# Patient Record
Sex: Male | Born: 1995 | Race: White | Hispanic: No | Marital: Married | Smoking: Current every day smoker
Health system: Southern US, Community
[De-identification: ages and names within clinical notes are randomized; demographics above are authoritative.]

## PROBLEM LIST (undated history)

## (undated) DIAGNOSIS — K219 Gastro-esophageal reflux disease without esophagitis: Secondary | ICD-10-CM

## (undated) DIAGNOSIS — S3991XA Unspecified injury of abdomen, initial encounter: Secondary | ICD-10-CM

## (undated) HISTORY — PX: COLON SURGERY: SHX602

---
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
## 2017-08-05 DIAGNOSIS — S3991XA Unspecified injury of abdomen, initial encounter: Secondary | ICD-10-CM

## 2017-08-05 HISTORY — DX: Unspecified injury of abdomen, initial encounter: S39.91XA

## 2017-08-07 ENCOUNTER — Other Ambulatory Visit: Payer: Self-pay

## 2017-08-07 ENCOUNTER — Encounter (HOSPITAL_COMMUNITY): Payer: Self-pay | Admitting: Anesthesiology

## 2017-08-07 ENCOUNTER — Encounter (HOSPITAL_COMMUNITY): Admission: EM | Disposition: A | Discharge status: Home Health | Payer: Self-pay | Source: Home / Self Care

## 2017-08-07 ENCOUNTER — Inpatient Hospital Stay (HOSPITAL_COMMUNITY)
Admission: EM | Admit: 2017-08-07 | Discharge: 2017-08-17 | DRG: 329 | Disposition: A | Discharge status: Home Health | Payer: No Typology Code available for payment source | Attending: General Surgery | Admitting: General Surgery

## 2017-08-07 ENCOUNTER — Emergency Department (HOSPITAL_COMMUNITY): Payer: No Typology Code available for payment source | Admitting: Anesthesiology

## 2017-08-07 ENCOUNTER — Emergency Department (HOSPITAL_COMMUNITY): Payer: No Typology Code available for payment source

## 2017-08-07 DIAGNOSIS — K651 Peritoneal abscess: Secondary | ICD-10-CM | POA: Diagnosis present

## 2017-08-07 DIAGNOSIS — Y9289 Other specified places as the place of occurrence of the external cause: Secondary | ICD-10-CM | POA: Diagnosis not present

## 2017-08-07 DIAGNOSIS — K567 Ileus, unspecified: Secondary | ICD-10-CM | POA: Diagnosis not present

## 2017-08-07 DIAGNOSIS — Y9389 Activity, other specified: Secondary | ICD-10-CM | POA: Diagnosis not present

## 2017-08-07 DIAGNOSIS — T8149XA Infection following a procedure, other surgical site, initial encounter: Secondary | ICD-10-CM

## 2017-08-07 DIAGNOSIS — K219 Gastro-esophageal reflux disease without esophagitis: Secondary | ICD-10-CM | POA: Diagnosis present

## 2017-08-07 DIAGNOSIS — R188 Other ascites: Secondary | ICD-10-CM

## 2017-08-07 DIAGNOSIS — S36438A Laceration of other part of small intestine, initial encounter: Secondary | ICD-10-CM | POA: Diagnosis present

## 2017-08-07 DIAGNOSIS — R109 Unspecified abdominal pain: Secondary | ICD-10-CM

## 2017-08-07 DIAGNOSIS — R509 Fever, unspecified: Secondary | ICD-10-CM

## 2017-08-07 DIAGNOSIS — F17210 Nicotine dependence, cigarettes, uncomplicated: Secondary | ICD-10-CM | POA: Diagnosis present

## 2017-08-07 DIAGNOSIS — Y990 Civilian activity done for income or pay: Secondary | ICD-10-CM

## 2017-08-07 DIAGNOSIS — W228XXA Striking against or struck by other objects, initial encounter: Secondary | ICD-10-CM | POA: Diagnosis present

## 2017-08-07 DIAGNOSIS — R05 Cough: Secondary | ICD-10-CM

## 2017-08-07 DIAGNOSIS — R198 Other specified symptoms and signs involving the digestive system and abdomen: Secondary | ICD-10-CM | POA: Diagnosis present

## 2017-08-07 DIAGNOSIS — R058 Other specified cough: Secondary | ICD-10-CM

## 2017-08-07 HISTORY — DX: Gastro-esophageal reflux disease without esophagitis: K21.9

## 2017-08-07 HISTORY — PX: APPENDECTOMY: SHX54

## 2017-08-07 HISTORY — DX: Unspecified injury of abdomen, initial encounter: S39.91XA

## 2017-08-07 HISTORY — PX: ILEOCECETOMY: SHX5857

## 2017-08-07 HISTORY — PX: LAPAROTOMY: SHX154

## 2017-08-07 HISTORY — PX: BOWEL RESECTION: SHX1257

## 2017-08-07 LAB — COMPREHENSIVE METABOLIC PANEL
ALT: 13 U/L — ABNORMAL LOW (ref 17–63)
ANION GAP: 13 (ref 5–15)
AST: 17 U/L (ref 15–41)
Albumin: 4.3 g/dL (ref 3.5–5.0)
Alkaline Phosphatase: 72 U/L (ref 38–126)
BUN: 17 mg/dL (ref 6–20)
CO2: 23 mmol/L (ref 22–32)
Calcium: 10 mg/dL (ref 8.9–10.3)
Chloride: 101 mmol/L (ref 101–111)
Creatinine, Ser: 1.15 mg/dL (ref 0.61–1.24)
GFR calc Af Amer: >60 mL/min (ref >60)
GLUCOSE: 142 mg/dL — AB (ref 65–99)
POTASSIUM: 4.1 mmol/L (ref 3.5–5.1)
SODIUM: 137 mmol/L (ref 135–145)
Total Bilirubin: 1.5 mg/dL — ABNORMAL HIGH (ref 0.3–1.2)
Total Protein: 8.3 g/dL — ABNORMAL HIGH (ref 6.5–8.1)

## 2017-08-07 LAB — TYPE AND SCREEN
ABO/RH(D): O NEG
ABO/RH(D): O NEG
ANTIBODY SCREEN: NEGATIVE
Antibody Screen: NEGATIVE

## 2017-08-07 LAB — CBC WITH DIFFERENTIAL/PLATELET
BAND NEUTROPHILS: 15 %
BASOS ABS: 0 10*3/uL (ref 0.0–0.1)
BASOS PCT: 0 %
EOS ABS: 0 10*3/uL (ref 0.0–0.7)
Eosinophils Relative: 0 %
HCT: 48.3 % (ref 39.0–52.0)
HEMOGLOBIN: 17.3 g/dL — AB (ref 13.0–17.0)
LYMPHS PCT: 0 %
Lymphs Abs: 0 10*3/uL — ABNORMAL LOW (ref 0.7–4.0)
MCH: 30.2 pg (ref 26.0–34.0)
MCHC: 35.8 g/dL (ref 30.0–36.0)
MCV: 84.3 fL (ref 78.0–100.0)
MONO ABS: 1.4 10*3/uL — AB (ref 0.1–1.0)
MONOS PCT: 7 %
NEUTROS PCT: 78 %
Neutro Abs: 19.1 10*3/uL — ABNORMAL HIGH (ref 1.7–7.7)
PLATELETS: 204 10*3/uL (ref 150–400)
RBC: 5.73 MIL/uL (ref 4.22–5.81)
RDW: 13.5 % (ref 11.5–15.5)
WBC MORPHOLOGY: INCREASED
WBC: 20.5 10*3/uL — ABNORMAL HIGH (ref 4.0–10.5)

## 2017-08-07 LAB — LIPASE, BLOOD: LIPASE: 23 U/L (ref 11–51)

## 2017-08-07 LAB — URINALYSIS, ROUTINE W REFLEX MICROSCOPIC
BILIRUBIN URINE: NEGATIVE
GLUCOSE, UA: NEGATIVE mg/dL
Ketones, ur: 20 mg/dL — AB
LEUKOCYTES UA: NEGATIVE
NITRITE: NEGATIVE
PH: 5 (ref 5.0–8.0)
Protein, ur: 100 mg/dL — AB
Specific Gravity, Urine: 1.032 — ABNORMAL HIGH (ref 1.005–1.030)

## 2017-08-07 LAB — ABO/RH: ABO/RH(D): O NEG

## 2017-08-07 SURGERY — EXCISION, CECUM WITH ILEUM
Anesthesia: General

## 2017-08-07 MED ORDER — DEXAMETHASONE SODIUM PHOSPHATE 10 MG/ML IJ SOLN
INTRAMUSCULAR | Status: AC
  Filled 2017-08-07: qty 1

## 2017-08-07 MED ORDER — ONDANSETRON HCL 4 MG/2ML IJ SOLN
INTRAMUSCULAR | Status: AC
  Filled 2017-08-07: qty 2

## 2017-08-07 MED ORDER — DIPHENHYDRAMINE HCL 25 MG PO CAPS: 25.0000 mg | ORAL_CAPSULE | Freq: Four times a day (QID) | ORAL | Status: DC | PRN

## 2017-08-07 MED ORDER — MORPHINE SULFATE (PF) 4 MG/ML IV SOLN
4.0000 mg | Freq: Once | INTRAVENOUS | Status: AC
  Administered 2017-08-07: 4 mg via INTRAVENOUS
  Filled 2017-08-07: qty 1

## 2017-08-07 MED ORDER — DIPHENHYDRAMINE HCL 50 MG/ML IJ SOLN: 25.0000 mg | Freq: Four times a day (QID) | INTRAMUSCULAR | Status: DC | PRN

## 2017-08-07 MED ORDER — PROPOFOL 10 MG/ML IV BOLUS
INTRAVENOUS | Status: DC | PRN
  Administered 2017-08-07: 120 mg via INTRAVENOUS

## 2017-08-07 MED ORDER — ROCURONIUM BROMIDE 100 MG/10ML IV SOLN
INTRAVENOUS | Status: DC | PRN
  Administered 2017-08-07: 20 mg via INTRAVENOUS
  Administered 2017-08-07: 50 mg via INTRAVENOUS

## 2017-08-07 MED ORDER — ONDANSETRON 4 MG PO TBDP
4.0000 mg | ORAL_TABLET | Freq: Four times a day (QID) | ORAL | Status: DC | PRN
  Administered 2017-08-12: 4 mg via ORAL
  Filled 2017-08-07: qty 1

## 2017-08-07 MED ORDER — ONDANSETRON HCL 4 MG/2ML IJ SOLN
4.0000 mg | Freq: Once | INTRAMUSCULAR | Status: AC
  Administered 2017-08-07: 4 mg via INTRAVENOUS
  Filled 2017-08-07: qty 2

## 2017-08-07 MED ORDER — SODIUM CHLORIDE 0.9 % IV BOLUS
1000.0000 mL | Freq: Once | INTRAVENOUS | Status: AC
  Administered 2017-08-07: 1000 mL via INTRAVENOUS

## 2017-08-07 MED ORDER — SUGAMMADEX SODIUM 200 MG/2ML IV SOLN
INTRAVENOUS | Status: DC | PRN
  Administered 2017-08-07: 160 mg via INTRAVENOUS

## 2017-08-07 MED ORDER — ONDANSETRON HCL 4 MG/2ML IJ SOLN: 4.0000 mg | Freq: Four times a day (QID) | INTRAMUSCULAR | Status: DC | PRN

## 2017-08-07 MED ORDER — PIPERACILLIN-TAZOBACTAM 3.375 G IVPB 30 MIN
3.3750 g | Freq: Once | INTRAVENOUS | Status: AC
  Administered 2017-08-07: 3.375 g via INTRAVENOUS
  Filled 2017-08-07: qty 50

## 2017-08-07 MED ORDER — DEXAMETHASONE SODIUM PHOSPHATE 10 MG/ML IJ SOLN
INTRAMUSCULAR | Status: DC | PRN
  Administered 2017-08-07: 5 mg via INTRAVENOUS

## 2017-08-07 MED ORDER — LIDOCAINE HCL (CARDIAC) PF 100 MG/5ML IV SOSY
PREFILLED_SYRINGE | INTRAVENOUS | Status: DC | PRN
  Administered 2017-08-07: 60 mg via INTRAVENOUS

## 2017-08-07 MED ORDER — NALOXONE HCL 0.4 MG/ML IJ SOLN: 0.4000 mg | INTRAMUSCULAR | Status: DC | PRN

## 2017-08-07 MED ORDER — KETOROLAC TROMETHAMINE 30 MG/ML IJ SOLN
INTRAMUSCULAR | Status: AC
  Administered 2017-08-07: 30 mg via INTRAVENOUS
  Filled 2017-08-07: qty 1

## 2017-08-07 MED ORDER — KETOROLAC TROMETHAMINE 30 MG/ML IJ SOLN
30.0000 mg | Freq: Four times a day (QID) | INTRAMUSCULAR | Status: AC
  Administered 2017-08-07 – 2017-08-08 (×3): 30 mg via INTRAVENOUS
  Filled 2017-08-07 (×3): qty 1

## 2017-08-07 MED ORDER — IOHEXOL 300 MG/ML  SOLN
100.0000 mL | Freq: Once | INTRAMUSCULAR | Status: AC | PRN
  Administered 2017-08-07: 100 mL via INTRAVENOUS

## 2017-08-07 MED ORDER — PIPERACILLIN-TAZOBACTAM 3.375 G IVPB
3.3750 g | Freq: Three times a day (TID) | INTRAVENOUS | Status: DC
  Administered 2017-08-07 – 2017-08-11 (×11): 3.375 g via INTRAVENOUS
  Filled 2017-08-07 (×12): qty 50

## 2017-08-07 MED ORDER — ARTIFICIAL TEARS OPHTHALMIC OINT
TOPICAL_OINTMENT | OPHTHALMIC | Status: DC | PRN
  Administered 2017-08-07: 1 via OPHTHALMIC

## 2017-08-07 MED ORDER — POTASSIUM CHLORIDE IN NACL 20-0.9 MEQ/L-% IV SOLN
INTRAVENOUS | Status: DC
  Administered 2017-08-07 – 2017-08-16 (×18): via INTRAVENOUS
  Filled 2017-08-07 (×19): qty 1000

## 2017-08-07 MED ORDER — SUCCINYLCHOLINE CHLORIDE 20 MG/ML IJ SOLN
INTRAMUSCULAR | Status: DC | PRN
  Administered 2017-08-07: 70 mg via INTRAVENOUS

## 2017-08-07 MED ORDER — HYDROMORPHONE HCL 2 MG/ML IJ SOLN
0.2500 mg | INTRAMUSCULAR | Status: DC | PRN
  Administered 2017-08-07 (×4): 0.5 mg via INTRAVENOUS

## 2017-08-07 MED ORDER — SUCCINYLCHOLINE CHLORIDE 200 MG/10ML IV SOSY
PREFILLED_SYRINGE | INTRAVENOUS | Status: AC
  Filled 2017-08-07: qty 10

## 2017-08-07 MED ORDER — SUGAMMADEX SODIUM 200 MG/2ML IV SOLN
INTRAVENOUS | Status: AC
  Filled 2017-08-07: qty 2

## 2017-08-07 MED ORDER — ONDANSETRON HCL 4 MG/2ML IJ SOLN
INTRAMUSCULAR | Status: DC | PRN
  Administered 2017-08-07: 4 mg via INTRAVENOUS

## 2017-08-07 MED ORDER — DIPHENHYDRAMINE HCL 50 MG/ML IJ SOLN: 12.5000 mg | Freq: Four times a day (QID) | INTRAMUSCULAR | Status: DC | PRN

## 2017-08-07 MED ORDER — ONDANSETRON HCL 4 MG/2ML IJ SOLN
4.0000 mg | Freq: Once | INTRAMUSCULAR | Status: AC
Start: 2017-08-07 — End: 2017-08-07
  Administered 2017-08-07: 4 mg via INTRAVENOUS
  Filled 2017-08-07: qty 2

## 2017-08-07 MED ORDER — FENTANYL CITRATE (PF) 250 MCG/5ML IJ SOLN
INTRAMUSCULAR | Status: AC
Start: 2017-08-07 — End: ?
  Filled 2017-08-07: qty 5

## 2017-08-07 MED ORDER — ENOXAPARIN SODIUM 40 MG/0.4ML ~~LOC~~ SOLN
40.0000 mg | SUBCUTANEOUS | Status: DC
  Administered 2017-08-08 – 2017-08-14 (×7): 40 mg via SUBCUTANEOUS
  Filled 2017-08-07 (×7): qty 0.4

## 2017-08-07 MED ORDER — LACTATED RINGERS IV SOLN
INTRAVENOUS | Status: DC | PRN
Start: 2017-08-07 — End: 2017-08-07
  Administered 2017-08-07 (×3): via INTRAVENOUS

## 2017-08-07 MED ORDER — ARTIFICIAL TEARS OPHTHALMIC OINT
TOPICAL_OINTMENT | OPHTHALMIC | Status: AC
  Filled 2017-08-07: qty 3.5

## 2017-08-07 MED ORDER — ROCURONIUM BROMIDE 10 MG/ML (PF) SYRINGE
PREFILLED_SYRINGE | INTRAVENOUS | Status: AC
  Filled 2017-08-07: qty 5

## 2017-08-07 MED ORDER — HYDROMORPHONE HCL 2 MG/ML IJ SOLN
INTRAMUSCULAR | Status: AC
  Administered 2017-08-07: 0.5 mg via INTRAVENOUS
  Filled 2017-08-07: qty 1

## 2017-08-07 MED ORDER — MORPHINE SULFATE 2 MG/ML IV SOLN
INTRAVENOUS | Status: DC
  Administered 2017-08-07: 19:00:00 via INTRAVENOUS
  Administered 2017-08-08: 12 mg via INTRAVENOUS
  Administered 2017-08-08: 10.5 mg via INTRAVENOUS
  Administered 2017-08-08: 3 mg via INTRAVENOUS
  Administered 2017-08-08 (×2): 6 mg via INTRAVENOUS
  Administered 2017-08-08 (×2): 4.5 mg via INTRAVENOUS
  Administered 2017-08-09: 0 mg via INTRAVENOUS
  Administered 2017-08-09: 6 mg via INTRAVENOUS
  Administered 2017-08-09: 04:00:00 via INTRAVENOUS
  Administered 2017-08-09: 9 mg via INTRAVENOUS
  Administered 2017-08-09: 1.5 mg via INTRAVENOUS
  Administered 2017-08-10 (×2): 0 mg via INTRAVENOUS
  Filled 2017-08-07 (×2): qty 30
  Filled 2017-08-07: qty 25

## 2017-08-07 MED ORDER — MIDAZOLAM HCL 5 MG/5ML IJ SOLN
INTRAMUSCULAR | Status: DC | PRN
  Administered 2017-08-07: 1 mg via INTRAVENOUS

## 2017-08-07 MED ORDER — FENTANYL CITRATE (PF) 100 MCG/2ML IJ SOLN
INTRAMUSCULAR | Status: DC | PRN
  Administered 2017-08-07 (×5): 50 ug via INTRAVENOUS

## 2017-08-07 MED ORDER — PROPOFOL 10 MG/ML IV BOLUS
INTRAVENOUS | Status: AC
  Filled 2017-08-07: qty 40

## 2017-08-07 MED ORDER — SODIUM CHLORIDE 0.9% FLUSH: 9.0000 mL | INTRAVENOUS | Status: DC | PRN

## 2017-08-07 MED ORDER — 0.9 % SODIUM CHLORIDE (POUR BTL) OPTIME
TOPICAL | Status: DC | PRN
  Administered 2017-08-07: 3000 mL

## 2017-08-07 MED ORDER — DIPHENHYDRAMINE HCL 12.5 MG/5ML PO ELIX: 12.5000 mg | ORAL_SOLUTION | Freq: Four times a day (QID) | ORAL | Status: DC | PRN

## 2017-08-07 MED ORDER — MIDAZOLAM HCL 2 MG/2ML IJ SOLN
INTRAMUSCULAR | Status: AC
  Filled 2017-08-07: qty 2

## 2017-08-07 MED ORDER — LIDOCAINE 2% (20 MG/ML) 5 ML SYRINGE
INTRAMUSCULAR | Status: AC
  Filled 2017-08-07: qty 5

## 2017-08-07 SURGICAL SUPPLY — 42 items
BLADE CLIPPER SURG (BLADE) ×2 IMPLANT
BNDG GAUZE ELAST 4 BULKY (GAUZE/BANDAGES/DRESSINGS) ×2 IMPLANT
CANISTER SUCT 3000ML PPV (MISCELLANEOUS) ×2 IMPLANT
COVER SURGICAL LIGHT HANDLE (MISCELLANEOUS) ×2 IMPLANT
DRAPE LAPAROSCOPIC ABDOMINAL (DRAPES) ×2 IMPLANT
DRAPE WARM FLUID 44X44 (DRAPE) ×2 IMPLANT
DRSG OPSITE POSTOP 4X10 (GAUZE/BANDAGES/DRESSINGS) IMPLANT
DRSG OPSITE POSTOP 4X8 (GAUZE/BANDAGES/DRESSINGS) IMPLANT
ELECT BLADE 6.5 EXT (BLADE) IMPLANT
ELECT CAUTERY BLADE 6.4 (BLADE) ×2 IMPLANT
ELECT REM PT RETURN 9FT ADLT (ELECTROSURGICAL) ×2
ELECTRODE REM PT RTRN 9FT ADLT (ELECTROSURGICAL) ×1 IMPLANT
GAUZE SPONGE 4X4 12PLY STRL LF (GAUZE/BANDAGES/DRESSINGS) ×2 IMPLANT
GLOVE SURG SIGNA 7.5 PF LTX (GLOVE) ×2 IMPLANT
GOWN STRL REUS W/ TWL LRG LVL3 (GOWN DISPOSABLE) ×1 IMPLANT
GOWN STRL REUS W/ TWL XL LVL3 (GOWN DISPOSABLE) ×1 IMPLANT
GOWN STRL REUS W/TWL LRG LVL3 (GOWN DISPOSABLE) ×1
GOWN STRL REUS W/TWL XL LVL3 (GOWN DISPOSABLE) ×1
KIT BASIN OR (CUSTOM PROCEDURE TRAY) ×2 IMPLANT
KIT TURNOVER KIT B (KITS) ×2 IMPLANT
LIGASURE IMPACT 36 18CM CVD LR (INSTRUMENTS) ×2 IMPLANT
NS IRRIG 1000ML POUR BTL (IV SOLUTION) ×12 IMPLANT
PACK GENERAL/GYN (CUSTOM PROCEDURE TRAY) ×2 IMPLANT
PAD ARMBOARD 7.5X6 YLW CONV (MISCELLANEOUS) ×2 IMPLANT
RELOAD PROXIMATE 75MM BLUE (ENDOMECHANICALS) ×4 IMPLANT
SPECIMEN JAR LARGE (MISCELLANEOUS) IMPLANT
SPONGE LAP 18X18 X RAY DECT (DISPOSABLE) IMPLANT
STAPLER GUN LINEAR PROX 60 (STAPLE) ×2 IMPLANT
STAPLER PROXIMATE 75MM BLUE (STAPLE) ×2 IMPLANT
STAPLER VISISTAT 35W (STAPLE) ×2 IMPLANT
SUCTION POOLE TIP (SUCTIONS) ×2 IMPLANT
SUT PDS AB 1 TP1 96 (SUTURE) ×4 IMPLANT
SUT SILK 2 0 SH CR/8 (SUTURE) ×2 IMPLANT
SUT SILK 2 0 TIES 10X30 (SUTURE) ×2 IMPLANT
SUT SILK 3 0 SH CR/8 (SUTURE) ×6 IMPLANT
SUT SILK 3 0 TIES 10X30 (SUTURE) ×2 IMPLANT
SUT VIC AB 3-0 SH 18 (SUTURE) ×2 IMPLANT
TAPE CLOTH SURG 6X10 WHT LF (GAUZE/BANDAGES/DRESSINGS) ×2 IMPLANT
TOWEL OR 17X24 6PK STRL BLUE (TOWEL DISPOSABLE) ×2 IMPLANT
TOWEL OR 17X26 10 PK STRL BLUE (TOWEL DISPOSABLE) ×2 IMPLANT
TRAY FOLEY W/METER SILVER 16FR (SET/KITS/TRAYS/PACK) ×2 IMPLANT
YANKAUER SUCT BULB TIP NO VENT (SUCTIONS) IMPLANT
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
## 2017-08-07 NOTE — Transfer of Care (Signed)
Immediate Anesthesia Transfer of Care Note  Patient: Warren GristKevin Pennington  Procedure(s) Performed: EXPLORATORY LAPAROTOMY (N/A ) SMALL BOWEL RESECTION (N/A ) ILEOCECETOMY (N/A )  Patient Location: PACU  Anesthesia Type:General  Level of Consciousness: awake, alert  and oriented  Airway & Oxygen Therapy: Patient Spontanous Breathing and Patient connected to nasal cannula oxygen  Post-op Assessment: Report given to RN, Post -op Vital signs reviewed and stable and Patient moving all extremities X 4  Post vital signs: Reviewed and stable  Last Vitals:  Vitals Value Taken Time  BP 153/99 08/07/2017  6:37 PM  Temp    Pulse 118 08/07/2017  6:40 PM  Resp 22 08/07/2017  6:40 PM  SpO2 95 % 08/07/2017  6:40 PM  Vitals shown include unvalidated device data.  Last Pain:  Vitals:   08/07/17 1554  TempSrc:   PainSc: 3          Complications: No apparent anesthesia complications
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
## 2017-08-07 NOTE — ED Notes (Signed)
Patient transported to CT 

## 2017-08-07 NOTE — ED Notes (Signed)
[facility].  MD Magnus IvanBlackman is accepting provider  MD Donell BeersByerly is the sending provider.
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
## 2017-08-07 NOTE — Anesthesia Preprocedure Evaluation (Addendum)
Anesthesia Evaluation  Patient identified by MRN, date of birth, ID band Patient awake    Reviewed: Allergy & Precautions, NPO status , Patient's Chart, lab work & pertinent test results  History of Anesthesia Complications Negative for: history of anesthetic complications  Airway Mallampati: I  TM Distance: >3 FB Neck ROM: Full    Dental  (+) Dental Advisory Given, Teeth Intact,    Pulmonary neg pulmonary ROS,    breath sounds clear to auscultation       Cardiovascular negative cardio ROS   Rhythm:Regular     Neuro/Psych negative neurological ROS  negative psych ROS   GI/Hepatic Neg liver ROS, Perforated bowl with peritonitis    Endo/Other  negative endocrine ROS  Renal/GU negative Renal ROS     Musculoskeletal negative musculoskeletal ROS (+)   Abdominal   Peds  Hematology negative hematology ROS (+)   Anesthesia Other Findings   Reproductive/Obstetrics                            Anesthesia Physical Anesthesia Plan  ASA: I and emergent  Anesthesia Plan: General   Post-op Pain Management:    Induction: Intravenous, Rapid sequence and Cricoid pressure planned  PONV Risk Score and Plan: 2 and Ondansetron and Dexamethasone  Airway Management Planned: Oral ETT  Additional Equipment:   Intra-op Plan:   Post-operative Plan: Extubation in OR and Possible Post-op intubation/ventilation  Informed Consent: I have reviewed the patients History and Physical, chart, labs and discussed the procedure including the risks, benefits and alternatives for the proposed anesthesia with the patient or authorized representative who has indicated his/her understanding and acceptance.   Dental advisory given  Plan Discussed with: CRNA and Surgeon  Anesthesia Plan Comments:         Anesthesia Quick Evaluation
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
## 2017-08-07 NOTE — ED Notes (Signed)
ED Provider at bedside. 

## 2017-08-07 NOTE — Anesthesia Procedure Notes (Signed)
Procedure Name: Intubation Date/Time: 08/07/2017 5:09 PM Performed by: Edmonia CaprioAuston, Loriene Taunton M, CRNA Pre-anesthesia Checklist: Patient identified, Emergency Drugs available, Patient being monitored, Suction available and Timeout performed Patient Re-evaluated:Patient Re-evaluated prior to induction Oxygen Delivery Method: Circle system utilized Preoxygenation: Pre-oxygenation with 100% oxygen Induction Type: Rapid sequence Laryngoscope Size: Miller and 2 Grade View: Grade I Tube type: Oral Tube size: 7.5 mm Number of attempts: 1 Airway Equipment and Method: Stylet Placement Confirmation: ETT inserted through vocal cords under direct vision,  breath sounds checked- equal and bilateral and positive ETCO2 Secured at: 22 cm Tube secured with: Tape Dental Injury: Teeth and Oropharynx as per pre-operative assessment  Comments: Oropharynx/cords clear on DL

## 2017-08-08 ENCOUNTER — Encounter (HOSPITAL_COMMUNITY): Payer: Self-pay | Admitting: Surgery

## 2017-08-08 LAB — BASIC METABOLIC PANEL
ANION GAP: 7 (ref 5–15)
BUN: 16 mg/dL (ref 6–20)
CALCIUM: 8.5 mg/dL — AB (ref 8.9–10.3)
CHLORIDE: 105 mmol/L (ref 101–111)
CO2: 25 mmol/L (ref 22–32)
Creatinine, Ser: 0.98 mg/dL (ref 0.61–1.24)
GFR calc non Af Amer: >60 mL/min (ref >60)
Glucose, Bld: 128 mg/dL — ABNORMAL HIGH (ref 65–99)
Potassium: 4.3 mmol/L (ref 3.5–5.1)
SODIUM: 137 mmol/L (ref 135–145)

## 2017-08-08 LAB — CBC
HEMATOCRIT: 38 % — AB (ref 39.0–52.0)
HEMOGLOBIN: 12.9 g/dL — AB (ref 13.0–17.0)
MCH: 29.2 pg (ref 26.0–34.0)
MCHC: 33.9 g/dL (ref 30.0–36.0)
MCV: 86 fL (ref 78.0–100.0)
Platelets: 190 10*3/uL (ref 150–400)
RBC: 4.42 MIL/uL (ref 4.22–5.81)
RDW: 13.6 % (ref 11.5–15.5)
WBC: 8.9 10*3/uL (ref 4.0–10.5)

## 2017-08-08 LAB — ABO/RH: ABO/RH(D): O NEG
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
## 2017-08-08 NOTE — Anesthesia Postprocedure Evaluation (Signed)
Anesthesia Post Note  Patient: Warren Pennington  Procedure(s) Performed: EXPLORATORY LAPAROTOMY (N/A ) SMALL BOWEL RESECTION (N/A ) ILEOCECETOMY (N/A )     Patient location during evaluation: PACU Anesthesia Type: General Level of consciousness: awake and alert Pain management: pain level controlled Vital Signs Assessment: post-procedure vital signs reviewed and stable Respiratory status: spontaneous breathing, nonlabored ventilation, respiratory function stable and patient connected to nasal cannula oxygen Cardiovascular status: blood pressure returned to baseline and stable Postop Assessment: no apparent nausea or vomiting Anesthetic complications: no    Last Vitals:  Vitals:   08/07/17 2018 08/08/17 0019  BP: (!) 141/81   Pulse: 99   Resp: 19 17  Temp: 37.1 C   SpO2: 98% 97%    Last Pain:  Vitals:   08/08/17 0019  TempSrc:   PainSc: 2                  Aileen Amore

## 2017-08-09 ENCOUNTER — Other Ambulatory Visit: Payer: Self-pay

## 2017-08-09 ENCOUNTER — Encounter (HOSPITAL_COMMUNITY): Payer: Self-pay | Admitting: General Practice

## 2017-08-09 LAB — BASIC METABOLIC PANEL
Anion gap: 7 (ref 5–15)
BUN: 13 mg/dL (ref 6–20)
CHLORIDE: 104 mmol/L (ref 101–111)
CO2: 22 mmol/L (ref 22–32)
CREATININE: 0.85 mg/dL (ref 0.61–1.24)
Calcium: 8.4 mg/dL — ABNORMAL LOW (ref 8.9–10.3)
GFR calc Af Amer: >60 mL/min (ref >60)
GFR calc non Af Amer: >60 mL/min (ref >60)
Glucose, Bld: 96 mg/dL (ref 65–99)
Potassium: 4 mmol/L (ref 3.5–5.1)
SODIUM: 133 mmol/L — AB (ref 135–145)

## 2017-08-09 LAB — CBC
HEMATOCRIT: 36 % — AB (ref 39.0–52.0)
HEMOGLOBIN: 12.5 g/dL — AB (ref 13.0–17.0)
MCH: 29.6 pg (ref 26.0–34.0)
MCHC: 34.7 g/dL (ref 30.0–36.0)
MCV: 85.3 fL (ref 78.0–100.0)
Platelets: 193 10*3/uL (ref 150–400)
RBC: 4.22 MIL/uL (ref 4.22–5.81)
RDW: 13.9 % (ref 11.5–15.5)
WBC: 7.9 10*3/uL (ref 4.0–10.5)

## 2017-08-09 MED ORDER — KETOROLAC TROMETHAMINE 30 MG/ML IJ SOLN
30.0000 mg | Freq: Three times a day (TID) | INTRAMUSCULAR | Status: AC
  Administered 2017-08-09 (×3): 30 mg via INTRAVENOUS
  Filled 2017-08-09 (×3): qty 1

## 2017-08-09 MED ORDER — PHENOL 1.4 % MT LIQD
1.0000 | OROMUCOSAL | Status: DC | PRN
  Administered 2017-08-09: 1 via OROMUCOSAL
  Filled 2017-08-09: qty 177

## 2017-08-09 MED ORDER — ACETAMINOPHEN 10 MG/ML IV SOLN
1000.0000 mg | Freq: Four times a day (QID) | INTRAVENOUS | Status: AC
  Administered 2017-08-09 – 2017-08-10 (×4): 1000 mg via INTRAVENOUS
  Filled 2017-08-09 (×4): qty 100

## 2017-08-09 MED ORDER — MENTHOL 3 MG MT LOZG
1.0000 | LOZENGE | OROMUCOSAL | Status: DC | PRN
  Administered 2017-08-09: 3 mg via ORAL
  Filled 2017-08-09 (×3): qty 9

## 2017-08-09 NOTE — Progress Notes (Signed)
Wasted 2 MG morphine when changing PCA With Genuine PartsJosephine Pennington

## 2017-08-09 NOTE — Progress Notes (Signed)
2 mg of morphine pca wasted in the sink with Lovely RN
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
## 2017-08-10 MED ORDER — METHOCARBAMOL 750 MG PO TABS
750.0000 mg | ORAL_TABLET | Freq: Three times a day (TID) | ORAL | Status: DC | PRN
  Administered 2017-08-13 – 2017-08-16 (×6): 750 mg via ORAL
  Filled 2017-08-10 (×7): qty 1

## 2017-08-10 MED ORDER — IBUPROFEN 400 MG PO TABS
400.0000 mg | ORAL_TABLET | Freq: Three times a day (TID) | ORAL | Status: DC
  Administered 2017-08-10 – 2017-08-17 (×22): 400 mg via ORAL
  Filled 2017-08-10 (×22): qty 1

## 2017-08-10 MED ORDER — OXYCODONE HCL 5 MG PO TABS
5.0000 mg | ORAL_TABLET | ORAL | Status: DC | PRN
  Administered 2017-08-16: 5 mg via ORAL
  Filled 2017-08-10: qty 2
  Filled 2017-08-10: qty 1

## 2017-08-10 MED ORDER — MORPHINE SULFATE (PF) 4 MG/ML IV SOLN: 2.0000 mg | INTRAVENOUS | Status: DC | PRN

## 2017-08-10 MED ORDER — ACETAMINOPHEN 500 MG PO TABS
1000.0000 mg | ORAL_TABLET | Freq: Three times a day (TID) | ORAL | Status: DC
  Administered 2017-08-10 – 2017-08-17 (×22): 1000 mg via ORAL
  Filled 2017-08-10 (×22): qty 2

## 2017-08-10 NOTE — Progress Notes (Signed)
Wasted 20 ml of Morphine soln with nurse Shelbie Hutchingominique Lewis.
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
## 2017-08-10 NOTE — Progress Notes (Addendum)
[facility] Progress Note  3 Days Post-Op  Subjective: CC:  No new complaints. Pain rated 1/10. Having flatus and multiple non-bloody BMs. mobilizing  TMAX 100.1/24h, HR and BP WNL this AM Objective: Vital signs in last 24 hours: Temp:  [98.1 F (36.7 C)-100.1 F (37.8 C)] 100.1 F (37.8 C) (04/23 0600) Pulse Rate:  [91-102] 95 (04/23 0600) Resp:  [18-22] 19 (04/23 0600) BP: (138-145)/(79-99) 143/88 (04/23 0600) SpO2:  [92 %-100 %] 100 % (04/23 0600) Last BM Date: 08/10/17  Intake/Output from previous day: 04/22 0701 - 04/23 0700 In: 3037.1 [P.O.:60; I.V.:2427.1; IV Piggyback:550] Out: 1801 [Urine:200; Emesis/NG output:1600; Stool:1] Intake/Output this shift: No intake/output data recorded.  PE: Gen:  Alert, NAD, pleasant Card:  Regular rate and rhythm, pedal pulses 2+ BL Pulm:  Normal effort, clear to auscultation bilaterally Abd: Soft, midline incision w/ dressing in place c/d/i, hypoactive BS, non peritonitis.              NGT - 1600 cc/24h  GU: foley in place, dark yellow urine in cannister Skin: warm and dry, no rashes  Psych: A&Ox3     Lab Results:  Recent Labs    08/08/17 0917 08/09/17 0613  WBC 8.9 7.9  HGB 12.9* 12.5*  HCT 38.0* 36.0*  PLT 190 193   BMET Recent Labs    08/08/17 0917 08/09/17 0613  NA 137 133*  K 4.3 4.0  CL 105 104  CO2 25 22  GLUCOSE 128* 96  BUN 16 13  CREATININE 0.98 0.85  CALCIUM 8.5* 8.4*   PT/INR No results for input(s): LABPROT, INR in the last 72 hours. CMP     Component Value Date/Time   NA 133 (L) 08/09/2017 0613   K 4.0 08/09/2017 0613   CL 104 08/09/2017 0613   CO2 22 08/09/2017 0613   GLUCOSE 96 08/09/2017 0613   BUN 13 08/09/2017 0613   CREATININE 0.85 08/09/2017 0613   CALCIUM 8.4 (L) 08/09/2017 0613   PROT 8.3 (H) 08/07/2017 1133   ALBUMIN 4.3 08/07/2017 1133   AST 17 08/07/2017 1133   ALT 13 (L) 08/07/2017 1133   ALKPHOS 72 08/07/2017 1133   BILITOT 1.5 (H) 08/07/2017 1133   GFRNONAA >60 08/09/2017 0613   GFRAA >60 08/09/2017 0613   Lipase     Component Value Date/Time   LIPASE 23 08/07/2017 1133       Studies/Results: No results found.  Anti-infectives: Anti-infectives (From admission, onward)   Start     Dose/Rate Route Frequency Ordered Stop   08/07/17 2030  piperacillin-tazobactam (ZOSYN) IVPB 3.375 g     3.375 g 12.5 mL/hr over 240 Minutes Intravenous Every 8 hours 08/07/17 2018     08/07/17 1415  piperacillin-tazobactam (ZOSYN) IVPB 3.375 g     3.375 g 100 mL/hr over 30 Minutes Intravenous  Once 08/07/17 1401 08/07/17 1448     Assessment/Plan Traumatic perforation of distal ileum POD#2 s/pEXPLORATORY LAPAROTOMY,ILEOCECETOMY(08/07/17) Dr. Magnus IvanBlackman - afebrile past 24h, VSS, CBC and BMET stable yesterday - bowel function returning, clamp NGT, d/c this afternoon if goes well. - mobilize TID - IS - D/C PCA. Schedule tylenol and ibuprofen, PRN robaxin, PRN oxy, PRN morphine.  FEN: NPO, IVF, NGT ID: Zosyn 4/20 >> VTE: SCD's, Lovenox  Foley: none  Dispo: clamping trial, sips.   LOS: 3 days    Adam PhenixElizabeth S Angee Gupton , [employer]CA-C [facility] 08/10/2017, 8:27 AM Pager: [phone] Consults: [phone] Mon-Fri 7:00 am-4:30 pm Sat-Sun 7:00 am-11:30 am

## 2017-08-11 MED ORDER — LOPERAMIDE HCL 2 MG PO CAPS
2.0000 mg | ORAL_CAPSULE | Freq: Two times a day (BID) | ORAL | Status: DC
  Administered 2017-08-11 – 2017-08-13 (×6): 2 mg via ORAL
  Filled 2017-08-11 (×10): qty 1

## 2017-08-11 NOTE — Progress Notes (Addendum)
[facility] Progress Note  4 Days Post-Op  Subjective: CC: abdominal pain rated 0-2/10. Mobilizing. Still having a lot of diarrhea. Denies bloating, nausea, or vomiting on clears. Denies fever or chills but is still having some night sweats.   Objective: Vital signs in last 24 hours: Temp:  [98.8 F (37.1 C)-99.6 F (37.6 C)] 99.3 F (37.4 C) (04/24 0521) Pulse Rate:  [79-87] 87 (04/24 0521) Resp:  [16-20] 16 (04/24 0521) BP: (126-139)/(76-94) 133/94 (04/24 0521) SpO2:  [94 %-100 %] 99 % (04/24 0521) Last BM Date: 08/11/17  Intake/Output from previous day: 04/23 0701 - 04/24 0700 In: 480 [P.O.:480] Out: 700 [Urine:700] Intake/Output this shift: No intake/output data recorded.  PE: Gen:  Alert, NAD, pleasant Card:  Regular rate and rhythm, pedal pulses 2+ BL Pulm:  Normal effort, clear to auscultation bilaterally Abd: soft, non-tender, + BS     Skin: warm and dry, no rashes  Psych: A&Ox3   Lab Results:  Recent Labs    08/08/17 0917 08/09/17 0613  WBC 8.9 7.9  HGB 12.9* 12.5*  HCT 38.0* 36.0*  PLT 190 193   BMET Recent Labs    08/08/17 0917 08/09/17 0613  NA 137 133*  K 4.3 4.0  CL 105 104  CO2 25 22  GLUCOSE 128* 96  BUN 16 13  CREATININE 0.98 0.85  CALCIUM 8.5* 8.4*   PT/INR No results for input(s): LABPROT, INR in the last 72 hours. CMP     Component Value Date/Time   NA 133 (L) 08/09/2017 0613   K 4.0 08/09/2017 0613   CL 104 08/09/2017 0613   CO2 22 08/09/2017 0613   GLUCOSE 96 08/09/2017 0613   BUN 13 08/09/2017 0613   CREATININE 0.85 08/09/2017 0613   CALCIUM 8.4 (L) 08/09/2017 0613   PROT 8.3 (H) 08/07/2017 1133   ALBUMIN 4.3 08/07/2017 1133   AST 17 08/07/2017 1133   ALT 13 (L) 08/07/2017 1133   ALKPHOS 72 08/07/2017 1133   BILITOT 1.5 (H) 08/07/2017 1133   GFRNONAA >60 08/09/2017 0613   GFRAA >60 08/09/2017 0613   Lipase     Component Value Date/Time   LIPASE 23 08/07/2017 1133       Studies/Results: No  results found.  Anti-infectives: Anti-infectives (From admission, onward)   Start     Dose/Rate Route Frequency Ordered Stop   08/07/17 2030  piperacillin-tazobactam (ZOSYN) IVPB 3.375 g     3.375 g 12.5 mL/hr over 240 Minutes Intravenous Every 8 hours 08/07/17 2018     08/07/17 1415  piperacillin-tazobactam (ZOSYN) IVPB 3.375 g     3.375 g 100 mL/hr over 30 Minutes Intravenous  Once 08/07/17 1401 08/07/17 1448       Assessment/Plan Traumatic perforation of distal ileum POD#4s/pEXPLORATORY LAPAROTOMY,ILEOCECETOMY(08/07/17) Dr. Magnus IvanBlackman - afebrile past 24h, VSS - bowel function returned, NGT removed 4/23 - advance diet to full liquid  - mobilizeTID - IS - Pain: Scheduled tylenol and ibuprofen, PRN robaxin, PRN oxy, PRN morphine.  FEN: full liquid  ID: Zosyn 4/20 >> VTE: SCD's, Lovenox  Foley:none  Dispo: advance diet, DC Zosyn AM labs.    LOS: 4 days    Adam PhenixElizabeth S Simaan , [facility] 08/11/2017, 7:39 AM Pager: [phone] Consults: [phone] Mon-Fri 7:00 am-4:30 pm Sat-Sun 7:00 am-11:30 am

## 2017-08-12 ENCOUNTER — Inpatient Hospital Stay (HOSPITAL_COMMUNITY): Payer: No Typology Code available for payment source

## 2017-08-12 LAB — BASIC METABOLIC PANEL
ANION GAP: 9 (ref 5–15)
BUN: 14 mg/dL (ref 6–20)
CHLORIDE: 107 mmol/L (ref 101–111)
CO2: 21 mmol/L — ABNORMAL LOW (ref 22–32)
Calcium: 8.6 mg/dL — ABNORMAL LOW (ref 8.9–10.3)
Creatinine, Ser: 0.67 mg/dL (ref 0.61–1.24)
GFR calc Af Amer: >60 mL/min (ref >60)
GLUCOSE: 101 mg/dL — AB (ref 65–99)
POTASSIUM: 4 mmol/L (ref 3.5–5.1)
SODIUM: 137 mmol/L (ref 135–145)

## 2017-08-12 LAB — CBC
HEMATOCRIT: 29.9 % — AB (ref 39.0–52.0)
HEMOGLOBIN: 10.2 g/dL — AB (ref 13.0–17.0)
MCH: 28.7 pg (ref 26.0–34.0)
MCHC: 34.1 g/dL (ref 30.0–36.0)
MCV: 84.2 fL (ref 78.0–100.0)
Platelets: 223 10*3/uL (ref 150–400)
RBC: 3.55 MIL/uL — ABNORMAL LOW (ref 4.22–5.81)
RDW: 13.9 % (ref 11.5–15.5)
WBC: 10.6 10*3/uL — AB (ref 4.0–10.5)

## 2017-08-12 NOTE — Progress Notes (Addendum)
[facility] Progress Note  5 Days Post-Op  Subjective: CC:  Reports slight increase in incisional abdominal pain yesterday which he attributes to laughing a lot with family as well as increased coughing. Cough is occasionally productive. Pt having minimal flatus but still having loose BMs. BMs today are described as less watery and dark brown compared to watery and green/black. BMs are less frequent. Denies chills, nausea, or vomiting. Denies dysuria. Pt is hungry and requesting more food.  TMAX 101.4, HR MAX 104 bpm Objective: Vital signs in last 24 hours: Temp:  [99 F (37.2 C)-101.4 F (38.6 C)] 99.7 F (37.6 C) (04/25 0519) Pulse Rate:  [98-104] 104 (04/25 0519) Resp:  [14-18] 18 (04/25 0519) BP: (130-132)/(81-87) 132/85 (04/25 0519) SpO2:  [98 %-99 %] 99 % (04/25 0519) Last BM Date: 08/12/17  Intake/Output from previous day: 04/24 0701 - 04/25 0700 In: 4620.8 [P.O.:120; I.V.:4500.8] Out: -  Intake/Output this shift: No intake/output data recorded.  PE: Gen:  Alert, NAD, pleasant Card:  Regular rate and rhythm, pedal pulses 2+ BL Pulm:  Normal effort, clear to auscultation bilaterally; pulling 2200 cc on IS Abd: Soft, appropriately tender, bowel sounds present in all 4 quadrants,midline incision c/d/i  Skin: warm and dry, no rashes  Psych: A&Ox3   Lab Results:  No results for input(s): WBC, HGB, HCT, PLT in the last 72 hours. BMET No results for input(s): NA, K, CL, CO2, GLUCOSE, BUN, CREATININE, CALCIUM in the last 72 hours. PT/INR No results for input(s): LABPROT, INR in the last 72 hours. CMP     Component Value Date/Time   NA 133 (L) 08/09/2017 0613   K 4.0 08/09/2017 0613   CL 104 08/09/2017 0613   CO2 22 08/09/2017 0613   GLUCOSE 96 08/09/2017 0613   BUN 13 08/09/2017 0613   CREATININE 0.85 08/09/2017 0613   CALCIUM 8.4 (L) 08/09/2017 0613   PROT 8.3 (H) 08/07/2017 1133   ALBUMIN 4.3 08/07/2017 1133   AST 17 08/07/2017 1133   ALT 13 (L)  08/07/2017 1133   ALKPHOS 72 08/07/2017 1133   BILITOT 1.5 (H) 08/07/2017 1133   GFRNONAA >60 08/09/2017 0613   GFRAA >60 08/09/2017 0613   Lipase     Component Value Date/Time   LIPASE 23 08/07/2017 1133       Studies/Results: No results found.  Anti-infectives: Anti-infectives (From admission, onward)   Start     Dose/Rate Route Frequency Ordered Stop   08/07/17 2030  piperacillin-tazobactam (ZOSYN) IVPB 3.375 g  Status:  Discontinued     3.375 g 12.5 mL/hr over 240 Minutes Intravenous Every 8 hours 08/07/17 2018 08/11/17 0939   08/07/17 1415  piperacillin-tazobactam (ZOSYN) IVPB 3.375 g     3.375 g 100 mL/hr over 30 Minutes Intravenous  Once 08/07/17 1401 08/07/17 1448       Assessment/Plan Traumatic perforation of distal ileum POD#5s/pEXPLORATORY LAPAROTOMY,ILEOCECETOMY(08/07/17) Dr. Magnus IvanBlackman -bowel function returned, NGT removed 4/23 - tolerating full liquid diet, advance to SOFT - mobilizeTID - IS -Pain: Scheduled tylenol and ibuprofen, PRN robaxin, PRN oxy, PRN morphine.   Fever - 101.4 last night, WBC 10.6 today (7.9 on 4/22), hgb 10.2 from 12.5; will get CXR given productive cough. May need repeat CT scan tomorrow for persistent fever/leukocytosis.  FEN: SOFT diet, BMET WNL  ID: Zosyn 4/20-4/24; will discuss re-starting Zosyn empirically with MD vs waiting for more results from CXR/possible CT VTE: SCD's, Lovenox  Foley:none  Dispo: fever workup. CXR, AM labs.     LOS:  5 days    Adam Phenix , [facility] 08/12/2017, 7:42 AM Pager: [phone] Consults: [phone] Mon-Fri 7:00 am-4:30 pm Sat-Sun 7:00 am-11:30 am

## 2017-08-12 NOTE — Progress Notes (Signed)
Spoke with Marny Lowenstein, [facility] Case Manager with [employer] (phone [phone], ext [phone]) regarding home health needs at discharge.  Pt will need HHRN at dc for wound management.  Met with pt and his mother to discuss dc arrangements; mom states she is somewhat squeamish, but her daughter will be able to be taught to do dressing changes.   WC Case manager will arrange [facility] services per order, likely with [facility], but will follow up with me tomorrow.    Faxed clinical information and [place] orders to [facility] case manager, per her request.  Fax:  [phone].  Reinaldo Raddle, RN, BSN  Trauma/Neuro ICU Case Manager [phone]

## 2017-08-13 ENCOUNTER — Inpatient Hospital Stay (HOSPITAL_COMMUNITY): Payer: No Typology Code available for payment source

## 2017-08-13 LAB — CBC
HCT: 27.7 % — ABNORMAL LOW (ref 39.0–52.0)
Hemoglobin: 9.5 g/dL — ABNORMAL LOW (ref 13.0–17.0)
MCH: 28.8 pg (ref 26.0–34.0)
MCHC: 34.3 g/dL (ref 30.0–36.0)
MCV: 83.9 fL (ref 78.0–100.0)
Platelets: 256 10*3/uL (ref 150–400)
RBC: 3.3 MIL/uL — AB (ref 4.22–5.81)
RDW: 14 % (ref 11.5–15.5)
WBC: 12.8 10*3/uL — ABNORMAL HIGH (ref 4.0–10.5)

## 2017-08-13 IMAGING — CT CT ABD-PELV W/ CM
2 of 4 series · 15 of 46 positions shown, 17 images · IV contrast (Omni 300)
Comparison: Preop CT scan [DATE]

CLINICAL DATA: Day 6 postop exploratory laparotomy and ileocecal
ectomy for traumatic intestinal perforation.

EXAM:
CT ABDOMEN AND PELVIS WITH CONTRAST
TECHNIQUE: Multidetector CT imaging of the abdomen and pelvis was performed
using the standard protocol following bolus administration of
intravenous contrast.
CONTRAST:  100mL [WG] IOPAMIDOL ([WG]) INJECTION 61%

[Series 3: a/p w/ 5mm · axial · 0.88mm/px · z∈[+642,+1122]mm · 12 of 110 slices shown, 14 images]
[im 9/110  soft-tissue]
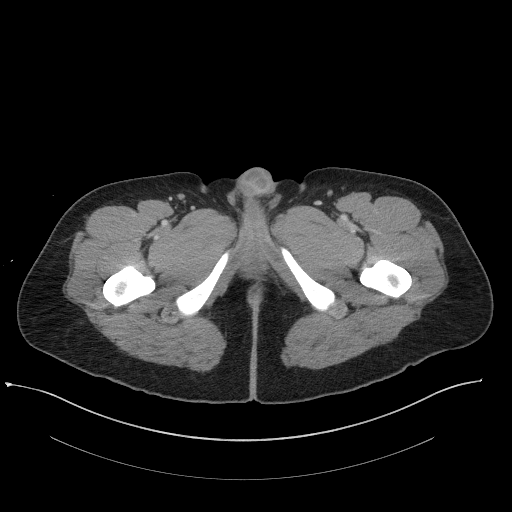
[im 9/110  bone]
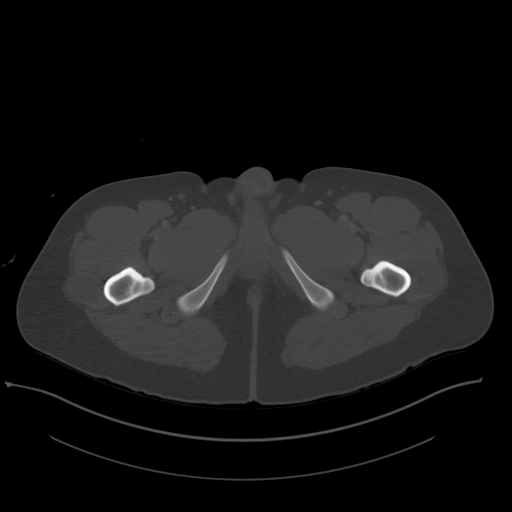
[im 18/110  soft-tissue]
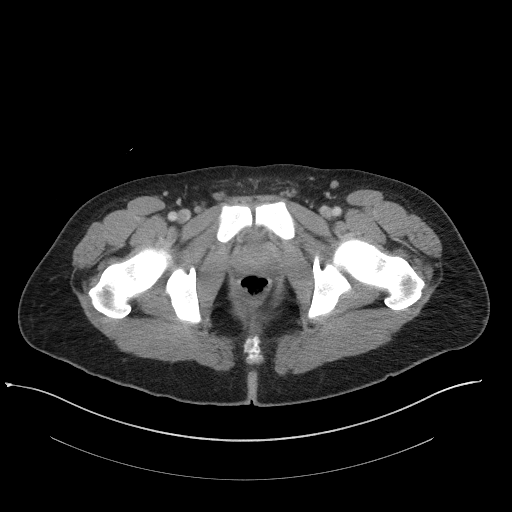
[im 27/110  soft-tissue]
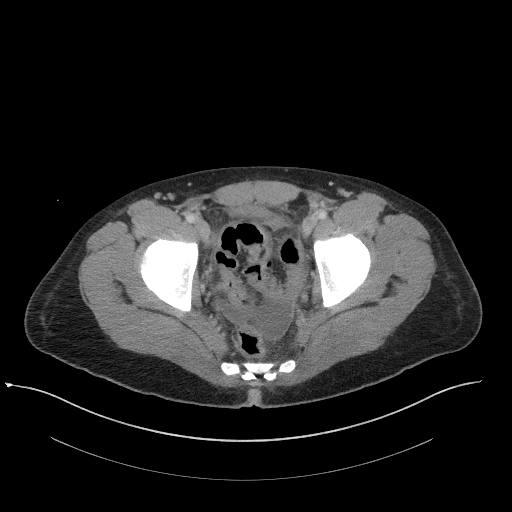
[im 35/110  soft-tissue]
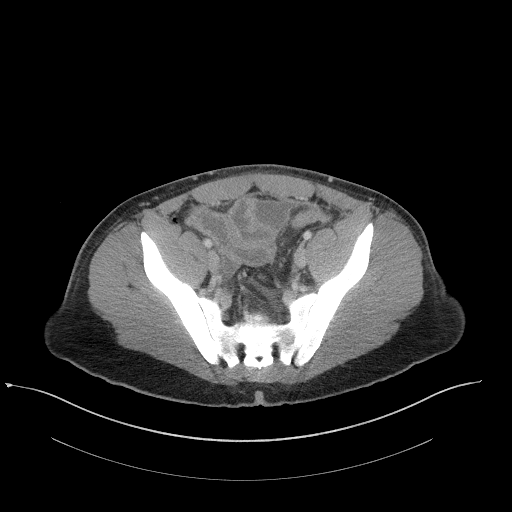
[im 44/110  soft-tissue]
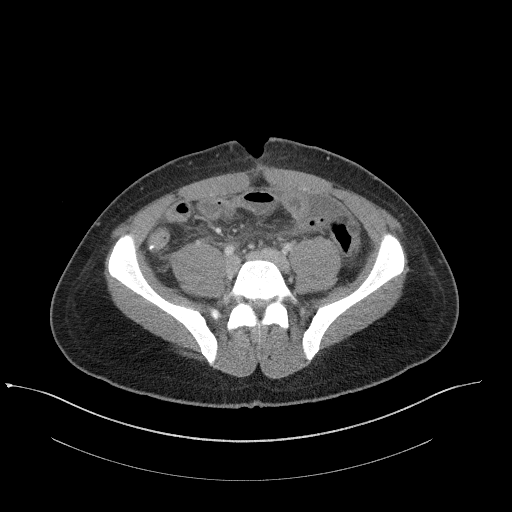
[im 53/110  soft-tissue]
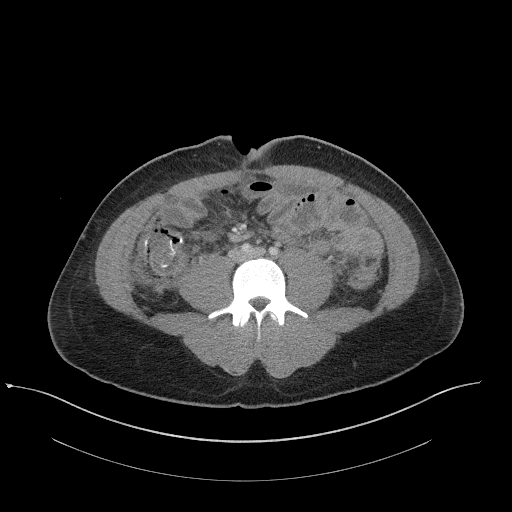
[im 62/110  soft-tissue]
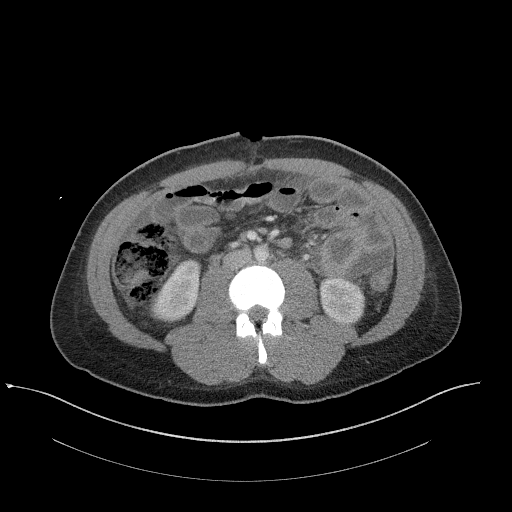
[im 70/110  soft-tissue]
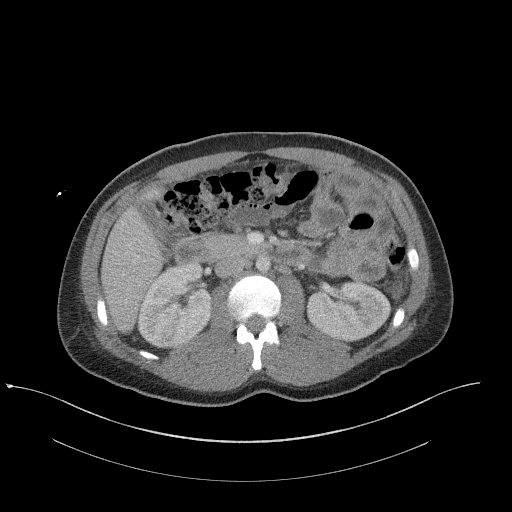
[im 79/110  soft-tissue]
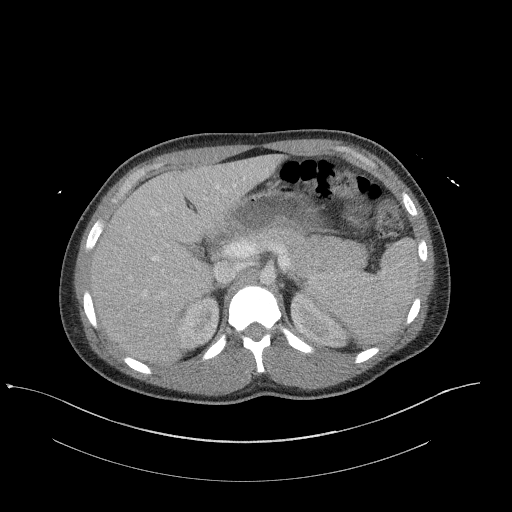
[im 79/110  bone]
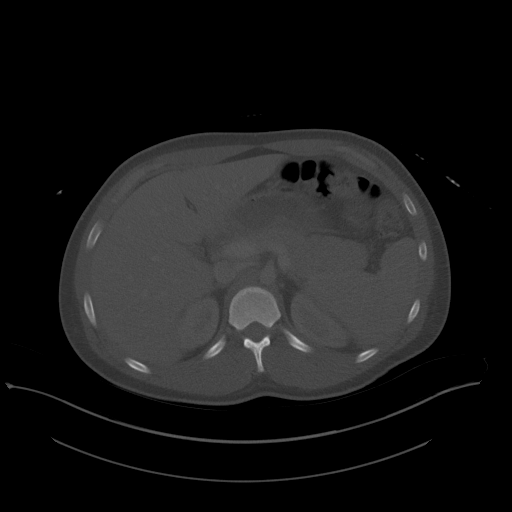
[im 88/110  soft-tissue]
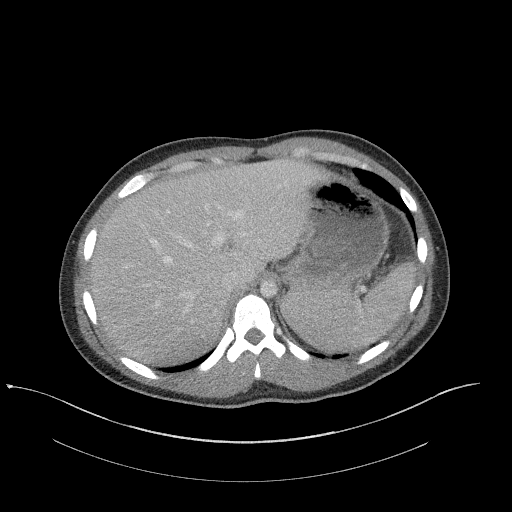
[im 96/110  soft-tissue]
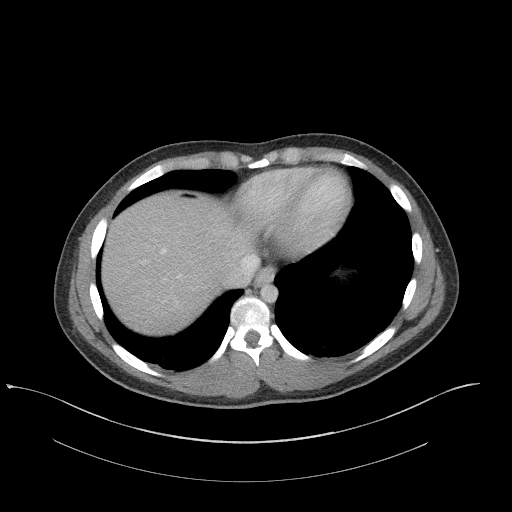
[im 105/110  soft-tissue]
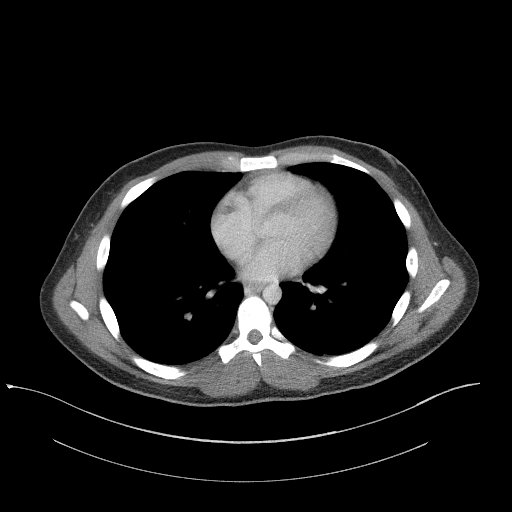

[Series 6: a/p w/ cor · coronal · 0.80mm/px · 3 of 150 slices shown]
[im 50/150  soft-tissue]
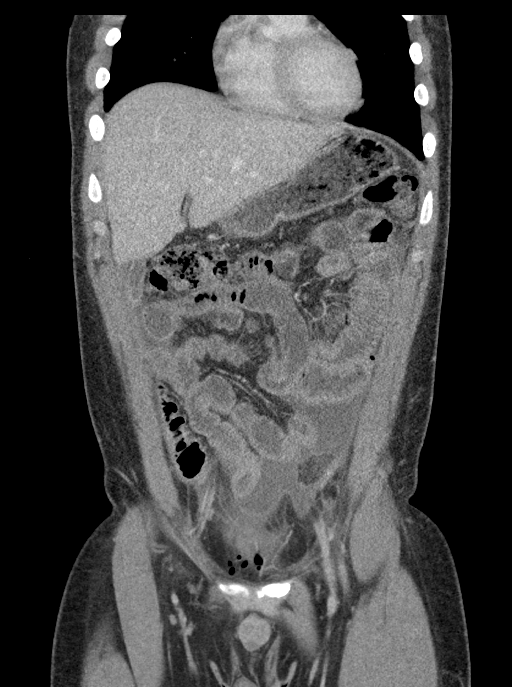
[im 67/150  soft-tissue]
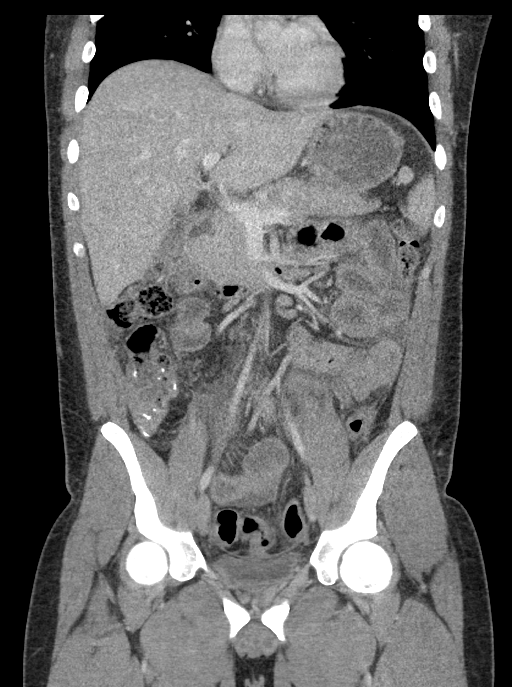
[im 83/150  soft-tissue]
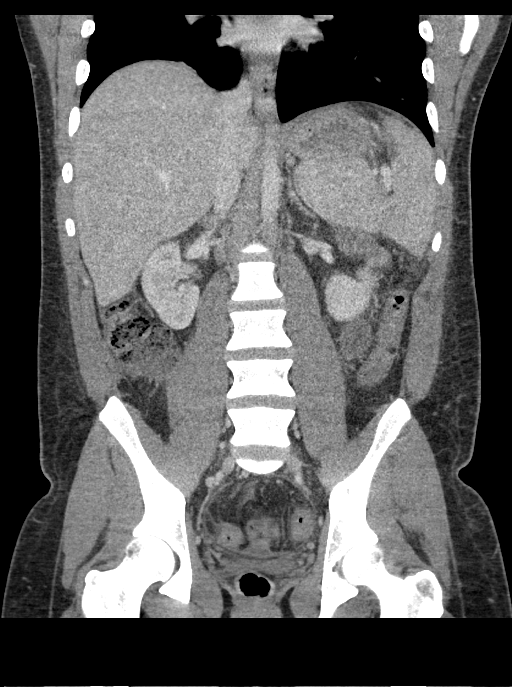

[15 of 46 positions shown; findings below may reference images not displayed]

FINDINGS: Lower chest: Minimal subsegmental bibasilar atelectasis. No
effusions. The heart is normal in size.

Hepatobiliary: No focal hepatic lesions or intrahepatic biliary
dilatation. The gallbladder is grossly normal. Some surrounding
fluid is noted. No common bile duct dilatation.

Pancreas: No mass, inflammation or ductal dilatation.

Spleen: Normal size.  No splenic injury or mass.

Adrenals/Urinary Tract: The adrenal glands and kidneys are normal.
The bladder is unremarkable.

Stomach/Bowel: Surgical changes in the right lower quadrant with
ileocolonic anastomosis. I do not see any complicating features in
this area. There are fluid-filled loops of small bowel in areas of
mild bowel wall thickening but normal bowel enhancement and no
findings to suggest obstruction. Minimal collection or free air is
noted under the right hemidiaphragm.

Vascular/Lymphatic: The aorta and branch vessels are patent. The
major venous structures are patent. There are numerous small
scattered mesenteric and retroperitoneal lymph nodes which are
likely hyperplastic/inflammatory.

Reproductive: The prostate gland and seminal vesicles are
unremarkable.

Other: There are several fluid collections demonstrating thin
rim-like enhancement and possibly suggesting peritonitis or
developing abscesses. Cul-de-sac fluid collection in the pelvis
measures a maximum of 5.7 cm. Fluid collection in the suprapubic
area measures a maximum of 7 cm. Small interloop fluid collections
are also noted in the left abdomen and left pericolic gutter.

Musculoskeletal: No significant bony findings.
IMPRESSION: 1. Expected postoperative changes in the right lower quadrant.
2. Minimal scattered free air is noted and most likely due to the
recent surgery.
3. Multiple rim enhancing fluid collections could suggest
peritonitis or developing abscesses.

## 2017-08-13 MED ORDER — IOPAMIDOL (ISOVUE-300) INJECTION 61%
100.0000 mL | Freq: Once | INTRAVENOUS | Status: AC | PRN
Start: 2017-08-13 — End: 2017-08-13
  Administered 2017-08-13: 100 mL via INTRAVENOUS

## 2017-08-13 MED ORDER — IOPAMIDOL (ISOVUE-300) INJECTION 61%
INTRAVENOUS | Status: AC
  Filled 2017-08-13: qty 100

## 2017-08-13 NOTE — Progress Notes (Addendum)
[facility] Progress Note  6 Days Post-Op  Subjective: CC:  Chills/night sweats overnight. Abd pain improved s/p good BM and flatus this AM. Having mild incisional pain exacerbated by movement. Tolerating PO. Mobilizing. Denies nausea or emesis.  TMAX 100 HR 105 BPM   Objective: Vital signs in last 24 hours: Temp:  [97.8 F (36.6 C)-100 F (37.8 C)] 99.1 F (37.3 C) (04/26 0500) Pulse Rate:  [100-105] 105 (04/26 0500) Resp:  [16-18] 18 (04/26 0500) BP: (129-135)/(80-85) 135/80 (04/26 0500) SpO2:  [98 %-99 %] 99 % (04/26 0500) Last BM Date: 08/10/17  Intake/Output from previous day: 04/25 0701 - 04/26 0700 In: 240 [P.O.:240] Out: -  Intake/Output this shift: No intake/output data recorded.  PE: Gen:  Alert, NAD, pleasant Card:  Regular rate and rhythm, pedal pulses 2+ BL Pulm:  Normal effort, clear to auscultation bilaterally; pulling 2200 cc on IS Abd: Soft, appropriately tender, bowel sounds present in all 4 quadrants,midline incision c/d/i  Skin: warm and dry, no rashes  Psych: A&Ox3   Lab Results:  Recent Labs    08/12/17 0654 08/13/17 0547  WBC 10.6* 12.8*  HGB 10.2* 9.5*  HCT 29.9* 27.7*  PLT 223 256   BMET Recent Labs    08/12/17 0654  NA 137  K 4.0  CL 107  CO2 21*  GLUCOSE 101*  BUN 14  CREATININE 0.67  CALCIUM 8.6*   PT/INR No results for input(s): LABPROT, INR in the last 72 hours. CMP     Component Value Date/Time   NA 137 08/12/2017 0654   K 4.0 08/12/2017 0654   CL 107 08/12/2017 0654   CO2 21 (L) 08/12/2017 0654   GLUCOSE 101 (H) 08/12/2017 0654   BUN 14 08/12/2017 0654   CREATININE 0.67 08/12/2017 0654   CALCIUM 8.6 (L) 08/12/2017 0654   PROT 8.3 (H) 08/07/2017 1133   ALBUMIN 4.3 08/07/2017 1133   AST 17 08/07/2017 1133   ALT 13 (L) 08/07/2017 1133   ALKPHOS 72 08/07/2017 1133   BILITOT 1.5 (H) 08/07/2017 1133   GFRNONAA >60 08/12/2017 0654   GFRAA >60 08/12/2017 0654   Lipase     Component Value  Date/Time   LIPASE 23 08/07/2017 1133       Studies/Results: Dg Chest Port 1 View  Result Date: 08/12/2017 CLINICAL DATA:  Fever and productive cough for 2 days EXAM: PORTABLE CHEST 1 VIEW COMPARISON:  None. FINDINGS: The heart size and mediastinal contours are within normal limits. Both lungs are clear. The visualized skeletal structures are unremarkable. IMPRESSION: No active disease. Electronically Signed   By: Alcide CleverMark  Lukens M.D.   On: 08/12/2017 10:18    Anti-infectives: Anti-infectives (From admission, onward)   Start     Dose/Rate Route Frequency Ordered Stop   08/07/17 2030  piperacillin-tazobactam (ZOSYN) IVPB 3.375 g  Status:  Discontinued     3.375 g 12.5 mL/hr over 240 Minutes Intravenous Every 8 hours 08/07/17 2018 08/11/17 0939   08/07/17 1415  piperacillin-tazobactam (ZOSYN) IVPB 3.375 g     3.375 g 100 mL/hr over 30 Minutes Intravenous  Once 08/07/17 1401 08/07/17 1448     Assessment/Plan Traumatic perforation of distal ileum POD#6s/pEXPLORATORY LAPAROTOMY,ILEOCECETOMY(08/07/17) Dr. Magnus IvanBlackman -bowel functionreturned,NGT removed 4/23 - tolerating SOFT diet - mobilizeTID - IS -Pain:Scheduledtylenol and ibuprofen, PRN robaxin, PRN oxy, PRN morphine.  Leukocytosis - 7.9 > 10.6 > 12.8 today  Fever - 101.4 late 4/24; low grade temps since that time, ~100. No unexpected given peritonitis.  FEN: SOFT  diet, BMET WNL ID: Zosyn 4/20-4/24 VTE: SCD's, Lovenox  Foley:none  Dispo: CT abd pelvis to r/o intra-abdominal abscess in the setting of chills, sinus tachycardia, worsening leukocytosis     LOS: 6 days    Adam Phenix , [facility] 08/13/2017, 7:59 AM Pager: [phone] Consults: [phone] Mon-Fri 7:00 am-4:30 pm Sat-Sun 7:00 am-11:30 am

## 2017-08-13 NOTE — Care Management Note (Signed)
Case Management Note  Patient Details  Name: Warren Pennington MRN: [id number] Date of Birth: 07-15-1995  Subjective/Objective:   Pt admitted on 08/07/17 s/p blunt trauma to abdomen with perforated viscus and peritonitis.  Pt s/p exploratory laparotomy with ileocecectomy on 08/07/17.  PTA, pt independent of ADLS, has supportive parents.                 Action/Plan: Will follow for discharge planning as pt progresses.  Will likely need HHRN for wound management at discharge.  Will follow.    Expected Discharge Date:                  Expected Discharge Plan:  Home w Home Health Services  In-House Referral:  Clinical Social Work  Discharge planning Services  CM Consult  Post Acute Care Choice:    Choice offered to:     DME Arranged:    DME Agency:     HH Arranged:  RN HH [facility]  Status of Service:  In process, will continue to follow  If discussed at Long Length of Stay Meetings, dates discussed:    Additional Comments:  08/13/17 J. Briseida Gittings, RN, BSN Confirmed with Wyline Beadyiffany Wallace, [facility] Case Manager with [employer], [facility]  Per Tiffany, should pt discharge over the weekend, notify Andrey CampanileSandy, Environmental education officerN Supervisor with [facility] agency (phone: [phone]).    Quintella BatonJulie W. Alexzandrea Normington, RN, BSN  Trauma/Neuro ICU Case Manager [phone]

## 2017-08-14 ENCOUNTER — Encounter (HOSPITAL_COMMUNITY): Payer: Self-pay | Admitting: Physician Assistant

## 2017-08-14 LAB — CBC
HEMATOCRIT: 23.9 % — AB (ref 39.0–52.0)
HEMOGLOBIN: 8.1 g/dL — AB (ref 13.0–17.0)
MCH: 28.6 pg (ref 26.0–34.0)
MCHC: 33.9 g/dL (ref 30.0–36.0)
MCV: 84.5 fL (ref 78.0–100.0)
Platelets: 289 10*3/uL (ref 150–400)
RBC: 2.83 MIL/uL — ABNORMAL LOW (ref 4.22–5.81)
RDW: 13.9 % (ref 11.5–15.5)
WBC: 16 10*3/uL — ABNORMAL HIGH (ref 4.0–10.5)

## 2017-08-14 MED ORDER — ENOXAPARIN SODIUM 40 MG/0.4ML ~~LOC~~ SOLN
40.0000 mg | SUBCUTANEOUS | Status: DC
  Administered 2017-08-16 – 2017-08-17 (×2): 40 mg via SUBCUTANEOUS
  Filled 2017-08-14 (×2): qty 0.4

## 2017-08-14 NOTE — H&P (Addendum)
Chief Complaint: Abdominal abscesses  Referring Physician(s): Claud Kelp  Supervising Physician: Jolaine Click  Patient Status: [facility] - In-pt  History of Present Illness: Warren Pennington is a 22 y.o. male who suffered blunt trauma with with a piece of lumber while at work and was found to have a small bowel perforation.  He is now post op day 7 after exploratory laparotomy, ileocecectomy, by Dr. Magnus Ivan on August 07, 2017.  He has clinically been improving but now has progressive leukocytosis. WBC is now up to 16.0 which prompted CT scan.  CT scan shows multiple rim-enhancing fluid collection suggesting abscess. There is a 5.7 cm fluid collection in the cul-de-sac. There is a 7 cm fluid collection in suprapubic area. Small interloop collections also noted.  We are asked to evaluate for drain placement.  He had a Lovenox injection this morning at 0816. Per our guidelines, one dose must be skipped before proceeding with CT guided drain.  Past Medical History:  Diagnosis Date  . Blunt injury of abdomen 08/05/2017   "hit w/2X4 @ work"  . GERD (gastroesophageal reflux disease)     Past Surgical History:  Procedure Laterality Date  . APPENDECTOMY  08/07/2017  . BOWEL RESECTION N/A 08/07/2017   Procedure: SMALL BOWEL RESECTION;  Surgeon: Abigail Miyamoto, MD;  Location: [facility] OR;  Service: General;  Laterality: N/A;  . COLON SURGERY    . ILEOCECETOMY N/A 08/07/2017   Procedure: Ferne Coe;  Surgeon: Abigail Miyamoto, MD;  Location: [facility] OR;  Service: General;  Laterality: N/A;  . LAPAROTOMY N/A 08/07/2017   Procedure: EXPLORATORY LAPAROTOMY;  Surgeon: Abigail Miyamoto, MD;  Location: [facility] OR;  Service: General;  Laterality: N/A;    Allergies: Patient has no known allergies.  Medications: Prior to Admission medications   Medication Sig Start Date End Date Taking? Authorizing Provider  naproxen sodium (ALEVE) 220 MG tablet Take 660 mg by mouth 2 (two) times daily as  needed (PAIN).   Yes [provider]     History reviewed. No pertinent family history.  Social History   Socioeconomic History  . Marital status: Single    Spouse name: Not on file  . Number of children: Not on file  . Years of education: Not on file  . Highest education level: Not on file  Occupational History  . Not on file  Social Needs  . Financial resource strain: Not on file  . Food insecurity:    Worry: Not on file    Inability: Not on file  . Transportation needs:    Medical: Not on file    Non-medical: Not on file  Tobacco Use  . Smoking status: Current Every Day Smoker    Packs/day: 1.00    Years: 5.00    Pack years: 5.00    Types: Cigarettes  . Smokeless tobacco: Never Used  Substance and Sexual Activity  . Alcohol use: Yes    Comment: 08/09/2017 "6 beers/month"  . Drug use: Not Currently  . Sexual activity: Yes  Lifestyle  . Physical activity:    Days per week: Not on file    Minutes per session: Not on file  . Stress: Not on file  Relationships  . Social connections:    Talks on phone: Not on file    Gets together: Not on file    Attends religious service: Not on file    Active member of club or organization: Not on file    Attends meetings of clubs or organizations: Not  on file    Relationship status: Not on file  Other Topics Concern  . Not on file  Social History Narrative  . Not on file     Review of Systems: A 12 point ROS discussed Review of Systems  Constitutional: Positive for activity change, appetite change and fatigue.  Respiratory: Negative.   Cardiovascular: Negative.   Gastrointestinal: Positive for abdominal pain.  Genitourinary: Negative.   Musculoskeletal: Negative.   Skin: Negative.   Neurological: Negative.   Hematological: Negative.   Psychiatric/Behavioral: Negative.     Vital Signs: BP 120/65 (BP Location: Right Arm)   Pulse 95   Temp 98 F (36.7 C) (Oral)   Resp 18   Ht  (1.702 m)   Wt 177 lb  (80.3 kg)   SpO2 99%   BMI 27.72 kg/m   Physical Exam  Constitutional: He is oriented to person, place, and time. He appears well-developed.  HENT:  Head: Normocephalic and atraumatic.  Eyes: EOM are normal.  Neck: Normal range of motion.  Cardiovascular: Regular rhythm and normal heart sounds.  HR 95  Pulmonary/Chest: Effort normal and breath sounds normal. No respiratory distress.  Abdominal: There is tenderness.  Musculoskeletal: Normal range of motion.  Neurological: He is alert and oriented to person, place, and time.  Skin: Skin is warm and dry.  Psychiatric: He has a normal mood and affect. His behavior is normal. Judgment and thought content normal.  Vitals reviewed.   Imaging: Ct Abdomen Pelvis W Contrast  Result Date: 08/13/2017 CLINICAL DATA:  Day 6 postop exploratory laparotomy and ileocecal ectomy for traumatic intestinal perforation. EXAM: CT ABDOMEN AND PELVIS WITH CONTRAST TECHNIQUE: Multidetector CT imaging of the abdomen and pelvis was performed using the standard protocol following bolus administration of intravenous contrast. CONTRAST:  ISOVUE-300 IOPAMIDOL (ISOVUE-300) INJECTION 61% COMPARISON:  Preop CT scan 08/07/2017 FINDINGS: Lower chest: Minimal subsegmental bibasilar atelectasis. No effusions. The heart is normal in size. Hepatobiliary: No focal hepatic lesions or intrahepatic biliary dilatation. The gallbladder is grossly normal. Some surrounding fluid is noted. No common bile duct dilatation. Pancreas: No mass, inflammation or ductal dilatation. Spleen: Normal size.  No splenic injury or mass. Adrenals/Urinary Tract: The adrenal glands and kidneys are normal. The bladder is unremarkable. Stomach/Bowel: Surgical changes in the right lower quadrant with ileocolonic anastomosis. I do not see any complicating features in this area. There are fluid-filled loops of small bowel in areas of mild bowel wall thickening but normal bowel enhancement and no findings to  suggest obstruction. Minimal collection or free air is noted under the right hemidiaphragm. Vascular/Lymphatic: The aorta and branch vessels are patent. The major venous structures are patent. There are numerous small scattered mesenteric and retroperitoneal lymph nodes which are likely hyperplastic/inflammatory. Reproductive: The prostate gland and seminal vesicles are unremarkable. Other: There are several fluid collections demonstrating thin rim-like enhancement and possibly suggesting peritonitis or developing abscesses. Cul-de-sac fluid collection in the pelvis measures a maximum of 5.7 cm. Fluid collection in the suprapubic area measures a maximum of 7 cm. Small interloop fluid collections are also noted in the left abdomen and left pericolic gutter. Musculoskeletal: No significant bony findings. IMPRESSION: 1. Expected postoperative changes in the right lower quadrant. 2. Minimal scattered free air is noted and most likely due to the recent surgery. 3. Multiple rim enhancing fluid collections could suggest peritonitis or developing abscesses. Electronically Signed   By: Rudie Meyer M.D.   On: 08/13/2017 18:07   Ct Abdomen Pelvis W Contrast  Result Date: 08/07/2017 CLINICAL DATA:  Pt was hit by a 2X6 on Thursday at 1800. Pt went to the urgent care and had an x-ray at urgent care that showed splenic flexure EXAM: CT ABDOMEN AND PELVIS WITH CONTRAST TECHNIQUE: Multidetector CT imaging of the abdomen and pelvis was performed using the standard protocol following bolus administration of intravenous contrast. CONTRAST:  OMNIPAQUE IOHEXOL 300 MG/ML  SOLN COMPARISON:  None. FINDINGS: Lower chest: Patchy subsegmental atelectasis in both lower lobes. No pleural effusion. No pneumothorax. Hepatobiliary: No focal liver abnormality is seen. No gallstones, gallbladder wall thickening, or biliary dilatation. Pancreas: Unremarkable. No pancreatic ductal dilatation or surrounding inflammatory changes. Spleen:  Normal in size without focal abnormality. Accessory splenule noted. Adrenals/Urinary Tract: Normal adrenals. Normal kidneys. Urinary bladder incompletely distended. Stomach/Bowel: Stomach is physiologically distended. There is mild fluid distention of the duodenum and proximal small bowel loops. Circumferential wall thickening suspected in jejunal loops in the left mid abdomen. Distal small bowel is decompressed. The terminal ileum is incompletely distended and may have some mild circumferential wall thickening. Normal appendix. The colon is nondilated, unremarkable. Vascular/Lymphatic: No significant vascular abnormality. There are few prominent mesenteric lymph nodes near the cecum and centrally in the mesentery. No retroperitoneal or pelvic adenopathy. Reproductive: Prostate is unremarkable. Other: There is a small amount of scattered probably loculated abdominal ascites. There is scattered tiny extraluminal gas bubbles in the peritoneal cavity some anteriorly, others near the terminal ileum, others just above the urinary bladder. Musculoskeletal: No fracture. No worrisome bone lesion. No body wall hematoma is evident. IMPRESSION: 1. A small amount of scattered possibly loculated abdominal ascites and extraluminal gas bubbles consistent with perforated viscus. 2. Possible wall thickening involving mid jejunum and terminal ileum. This may be mimicked by underdistention. 3. Negative for splenic pathology. 4. Right lower quadrant mild mesenteric adenopathy, possibly reactive but nonspecific. In the absence of the given history of blunt trauma, findings would also be suggestive of possible inflammatory bowel disease with perforation. Critical Value/emergent results were called by telephone at the time of interpretation on 08/07/2017 at 1:41 pm to Dr. Donnetta Hutching , who verbally acknowledged these results. Electronically Signed   By: Corlis Leak M.D.   On: 08/07/2017 13:43   Dg Chest Port 1 View  Result Date:  08/12/2017 CLINICAL DATA:  Fever and productive cough for 2 days EXAM: PORTABLE CHEST 1 VIEW COMPARISON:  None. FINDINGS: The heart size and mediastinal contours are within normal limits. Both lungs are clear. The visualized skeletal structures are unremarkable. IMPRESSION: No active disease. Electronically Signed   By: Alcide Clever M.D.   On: 08/12/2017 10:18    Labs:  CBC: Recent Labs    08/09/17 0613 08/12/17 0654 08/13/17 0547 08/14/17 0443  WBC 7.9 10.6* 12.8* 16.0*  HGB 12.5* 10.2* 9.5* 8.1*  HCT 36.0* 29.9* 27.7* 23.9*  PLT 193 223 256 289    COAGS: No results for input(s): INR, APTT in the last 8760 hours.  BMP: Recent Labs    08/07/17 1133 08/08/17 0917 08/09/17 0613 08/12/17 0654  NA 137 137 133* 137  K 4.1 4.3 4.0 4.0  CL 101 105 104 107  CO2 21*  GLUCOSE 142* 128* 96 101*  BUN CALCIUM 10.0 8.5* 8.4* 8.6*  CREATININE 1.15 0.98 0.85 0.67  GFRNONAA >60 >60 >60 >60  GFRAA >60 >60 >60 >60    LIVER FUNCTION TESTS: Recent Labs    08/07/17 1133  BILITOT 1.5*  AST 17  ALT 13*  ALKPHOS 72  PROT 8.3*  ALBUMIN 4.3    TUMOR MARKERS: No results for input(s): AFPTM, CEA, CA199, CHROMGRNA in the last 8760 hours.  Assessment and Plan:  Blunt trauma to the abdomen with small bowel perforation.  Post op day 7 after exploratory laparotomy, ileocecectomy, by Dr. Magnus Ivan on August 07, 2017.  WBC is now up to 16.0.  CT scan shows multiple rim-enhancing fluid collection suggesting abscess.  Will hold Lovenox in the am, NPO after MN, and proceed with CT guided drain tomorrow.  Risks and benefits discussed with the patient including bleeding, infection, damage to adjacent structures, bowel perforation/fistula connection, and sepsis.  All of the patient's questions were answered, patient is agreeable to proceed. Consent signed and in chart.  Thank you for this interesting consult.  I greatly enjoyed meeting Decklin Weddington and look forward  to participating in their care.  A copy of this report was sent to the requesting provider on this date.  Electronically Signed: Gwynneth Macleod, PA-C   08/14/2017, 10:56 AM      I spent a total of 40 Minutes in face to face in clinical consultation, greater than 50% of which was counseling/coordinating care for Drain placement.

## 2017-08-14 NOTE — Progress Notes (Signed)
7 Days Post-Op  Subjective: Says he feels pretty good.  Ambulating.  Tolerating diet.  Having bowel movements. Labs shows WBC rising to 16,000.  Hemoglobin 8.1.  Chemistries normal.  CT scan shows multiple rim-enhancing fluid collection suggesting abscess. 5.7 cm fluid collection in cul-de-sac 7 cm fluid collection in suprapubic area Small interloop collections also noted  Will ask IR to evaluate for drainage Discussed with patient and mother   Objective: Vital signs in last 24 hours: Temp:  [98 F (36.7 C)-99.2 F (37.3 C)] 98 F (36.7 C) (04/27 0513) Pulse Rate:  [95-112] 95 (04/27 0513) Resp:  [18-20] 18 (04/27 0513) BP: (118-127)/(63-73) 120/65 (04/27 0513) SpO2:  [99 %] 99 % (04/27 0513) Last BM Date: 08/13/17  Intake/Output from previous day: 04/26 0701 - 04/27 0700 In: 790 [P.O.:240; I.V.:550] Out: -  Intake/Output this shift: Total I/O In: -  Out: 200 [Urine:200]   EXAM: General appearance: Alert.  Cooperative.  Minimal distress.  Mother is present. Resp: clear to auscultation bilaterally GI: Soft.  Not distended.  Midline wound packed open and clean. Extremities: extremities normal, atraumatic, no cyanosis or edema  Lab Results:  Results for orders placed or performed during the hospital encounter of 08/07/17 (from the past 24 hour(s))  CBC     Status: Abnormal   Collection Time: 08/14/17  4:43 AM  Result Value Ref Range   WBC 16.0 (H) 4.0 - 10.5 K/uL   RBC 2.83 (L) 4.22 - 5.81 MIL/uL   Hemoglobin 8.1 (L) 13.0 - 17.0 g/dL   HCT 47.8 (L) 29.5 - 62.1 %   MCV 84.5 78.0 - 100.0 fL   MCH 28.6 26.0 - 34.0 pg   MCHC 33.9 30.0 - 36.0 g/dL   RDW 30.8 65.7 - 84.6 %   Platelets 289 150 - 400 K/uL     Studies/Results: Ct Abdomen Pelvis W Contrast  Result Date: 08/13/2017 CLINICAL DATA:  Day 6 postop exploratory laparotomy and ileocecal ectomy for traumatic intestinal perforation. EXAM: CT ABDOMEN AND PELVIS WITH CONTRAST TECHNIQUE: Multidetector CT imaging  of the abdomen and pelvis was performed using the standard protocol following bolus administration of intravenous contrast. CONTRAST:  ISOVUE-300 IOPAMIDOL (ISOVUE-300) INJECTION 61% COMPARISON:  Preop CT scan 08/07/2017 FINDINGS: Lower chest: Minimal subsegmental bibasilar atelectasis. No effusions. The heart is normal in size. Hepatobiliary: No focal hepatic lesions or intrahepatic biliary dilatation. The gallbladder is grossly normal. Some surrounding fluid is noted. No common bile duct dilatation. Pancreas: No mass, inflammation or ductal dilatation. Spleen: Normal size.  No splenic injury or mass. Adrenals/Urinary Tract: The adrenal glands and kidneys are normal. The bladder is unremarkable. Stomach/Bowel: Surgical changes in the right lower quadrant with ileocolonic anastomosis. I do not see any complicating features in this area. There are fluid-filled loops of small bowel in areas of mild bowel wall thickening but normal bowel enhancement and no findings to suggest obstruction. Minimal collection or free air is noted under the right hemidiaphragm. Vascular/Lymphatic: The aorta and branch vessels are patent. The major venous structures are patent. There are numerous small scattered mesenteric and retroperitoneal lymph nodes which are likely hyperplastic/inflammatory. Reproductive: The prostate gland and seminal vesicles are unremarkable. Other: There are several fluid collections demonstrating thin rim-like enhancement and possibly suggesting peritonitis or developing abscesses. Cul-de-sac fluid collection in the pelvis measures a maximum of 5.7 cm. Fluid collection in the suprapubic area measures a maximum of 7 cm. Small interloop fluid collections are also noted in the left abdomen and left pericolic  gutter. Musculoskeletal: No significant bony findings. IMPRESSION: 1. Expected postoperative changes in the right lower quadrant. 2. Minimal scattered free air is noted and most likely due to the recent  surgery. 3. Multiple rim enhancing fluid collections could suggest peritonitis or developing abscesses. Electronically Signed   By: Rudie Meyer M.D.   On: 08/13/2017 18:07    . acetaminophen  1,000 mg Oral Q8H  . enoxaparin (LOVENOX) injection  40 mg Subcutaneous Q24H  . ibuprofen  400 mg Oral Q8H  . loperamide  2 mg Oral BID     Assessment/Plan: s/p Procedure(s): EXPLORATORY LAPAROTOMY SMALL BOWEL RESECTION ILEOCECETOMY  POD#7 -exploratory laparotomy, ileocecectomy, Dr. Magnus Ivan, August 07, 2017 -Blunt trauma with 2 x 4 at work -Clinically improving but with progressive leukocytosis and CT findings, will ask for interventional radiology to drain these 2 larger fluid collections -Orders entered at this hour. Since he is feeling better and-fever has settled down I will hold off on antibiotics until I see the character of the fluid   FEN: SOFT diet, BMET WNL ID: Currently no antibiotics VTE: SCD's, Lovenox  Foley:none   @  LOS: 7 days    Zymir Napoli M Camryn Quesinberry 08/14/2017  . .prob

## 2017-08-15 ENCOUNTER — Inpatient Hospital Stay (HOSPITAL_COMMUNITY): Payer: No Typology Code available for payment source

## 2017-08-15 LAB — CBC
HCT: 22.7 % — ABNORMAL LOW (ref 39.0–52.0)
Hemoglobin: 7.8 g/dL — ABNORMAL LOW (ref 13.0–17.0)
MCH: 29.3 pg (ref 26.0–34.0)
MCHC: 34.4 g/dL (ref 30.0–36.0)
MCV: 85.3 fL (ref 78.0–100.0)
PLATELETS: 379 10*3/uL (ref 150–400)
RBC: 2.66 MIL/uL — ABNORMAL LOW (ref 4.22–5.81)
RDW: 14 % (ref 11.5–15.5)
WBC: 18.4 10*3/uL — ABNORMAL HIGH (ref 4.0–10.5)

## 2017-08-15 LAB — BASIC METABOLIC PANEL
Anion gap: 8 (ref 5–15)
BUN: 11 mg/dL (ref 6–20)
CO2: 25 mmol/L (ref 22–32)
Calcium: 8.8 mg/dL — ABNORMAL LOW (ref 8.9–10.3)
Chloride: 104 mmol/L (ref 101–111)
Creatinine, Ser: 0.66 mg/dL (ref 0.61–1.24)
GFR calc Af Amer: >60 mL/min (ref >60)
GLUCOSE: 93 mg/dL (ref 65–99)
Potassium: 4.5 mmol/L (ref 3.5–5.1)
Sodium: 137 mmol/L (ref 135–145)

## 2017-08-15 LAB — PROTIME-INR
INR: 1.17
PROTHROMBIN TIME: 14.8 s (ref 11.4–15.2)

## 2017-08-15 MED ORDER — MIDAZOLAM HCL 2 MG/2ML IJ SOLN
INTRAMUSCULAR | Status: AC
  Filled 2017-08-15: qty 6

## 2017-08-15 MED ORDER — SODIUM CHLORIDE 0.9% FLUSH
5.0000 mL | Freq: Three times a day (TID) | INTRAVENOUS | Status: DC
  Administered 2017-08-15 – 2017-08-16 (×5): 5 mL

## 2017-08-15 MED ORDER — MIDAZOLAM HCL 2 MG/2ML IJ SOLN
INTRAMUSCULAR | Status: AC | PRN
  Administered 2017-08-15: 1 mg via INTRAVENOUS
  Administered 2017-08-15: 0.5 mg via INTRAVENOUS
  Administered 2017-08-15 (×3): 1 mg via INTRAVENOUS
  Administered 2017-08-15: 0.5 mg via INTRAVENOUS
  Administered 2017-08-15 (×2): 1 mg via INTRAVENOUS
  Administered 2017-08-15: 0.5 mg via INTRAVENOUS

## 2017-08-15 MED ORDER — FENTANYL CITRATE (PF) 100 MCG/2ML IJ SOLN
INTRAMUSCULAR | Status: AC | PRN
  Administered 2017-08-15: 50 ug via INTRAVENOUS
  Administered 2017-08-15 (×3): 25 ug via INTRAVENOUS
  Administered 2017-08-15 (×5): 50 ug via INTRAVENOUS

## 2017-08-15 MED ORDER — EPINEPHRINE PF 1 MG/10ML IJ SOSY
PREFILLED_SYRINGE | INTRAMUSCULAR | Status: AC
  Filled 2017-08-15: qty 10

## 2017-08-15 MED ORDER — FENTANYL CITRATE (PF) 100 MCG/2ML IJ SOLN
INTRAMUSCULAR | Status: AC
  Filled 2017-08-15: qty 6

## 2017-08-15 NOTE — Procedures (Signed)
Abd abscess drain 10 Fr Pelvic abscess drain 10 Fr EBL 0 Comp 0

## 2017-08-15 NOTE — Sedation Documentation (Signed)
Abdominal drain placed. Pt tolerated well. Placed in prone position for second drain placement.

## 2017-08-15 NOTE — Progress Notes (Signed)
8 Days Post-Op  Subjective: A little more fatigued today but otherwise doing well.  Denies pain nausea or vomiting.  Tolerating diet and having bowel movements. IR plans CT-guided drainage of the 2 larger fluid collections today and patient is ready to have that done. Stable and alert. No lab work today   Objective: Vital signs in last 24 hours: Temp:  [98.5 F (36.9 C)-99.7 F (37.6 C)] 98.5 F (36.9 C) (04/28 0514) Pulse Rate:  [92-107] 92 (04/28 0514) Resp:  [17-19] 18 (04/28 0514) BP: (114-125)/(61-62) 125/62 (04/28 0514) SpO2:  [98 %-100 %] 99 % (04/28 0514) Last BM Date: 08/13/17  Intake/Output from previous day: 04/27 0701 - 04/28 0700 In: 1998.2 [P.O.:814; I.V.:1184.2] Out: 200 [Urine:200] Intake/Output this shift: No intake/output data recorded.     EXAM: General appearance: Alert.  Cooperative.  Minimal distress.   Resp: clear to auscultation bilaterally GI: Soft.  Not distended.  Midline wound packed open and clean.  No focal tenderness or mass. Extremities: extremities normal, atraumatic, no cyanosis or edema     Lab Results:  No results found for this or any previous visit (from the past 24 hour(s)).   Studies/Results: No results found.  Marland Kitchen EPINEPHrine      . acetaminophen  1,000 mg Oral Q8H  . [START ON 08/16/2017] enoxaparin (LOVENOX) injection  40 mg Subcutaneous Q24H  . ibuprofen  400 mg Oral Q8H  . loperamide  2 mg Oral BID     Assessment/Plan: s/p Procedure(s): EXPLORATORY LAPAROTOMY SMALL BOWEL RESECTION ILEOCECETOMY  POD#8 -exploratory laparotomy, ileocecectomy, Dr. Magnus Ivan, August 07, 2017 -Blunt trauma with 2 x 4 lumber at work -Clinically improving but with progressive leukocytosis and CT findings, will ask for interventional radiology to drain these 2 larger fluid collections  Since he is feeling better and-fever has settled down I will hold off on antibiotics until I see the character of the fluid   MWN:UUVO diet,BMET  WNL ID: Currently no antibiotics VTE: SCD's, Lovenox  Foley:none    @  LOS: 8 days    Ernestene Mention 08/15/2017  . .prob

## 2017-08-16 NOTE — Clinical Social Work Note (Signed)
Clinical Social Worker met with patient and patient mother at bedside to offer support and discuss patient needs at discharge.  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  Patient lives at home with his mother and plans to return home at discharge.  Patient states that he has communicated with his employer who states patient will have a job once medically cleared for return.   Clinical Social Worker inquired about current substance use.  Patient with no use at the time of the accident and no concerns regarding any type of current use.  SBIRT completed.  No resources needed at this time.  Clinical Social Worker will sign off for now as social work intervention is no longer needed. Please consult [place] again if new need arises.  Barbette Or, [place]

## 2017-08-16 NOTE — Progress Notes (Addendum)
Referring Physician(s): Claud Kelp  Supervising Physician: Richarda Overlie  Patient Status:  [facility] - In-pt  Chief Complaint: Abdominal abscess and pelvic abscess.  Subjective:  Hx blunt trauma with a piece of lumber at work, diagnosed with small bowel perforation and underwent exploratory laparotomy and ileocecectomy with Dr. Magnus Ivan 08/07/2017.  IR drain x2 (anterior and transgluteal) placed 08/15/2017 by Dr. Bonnielee Haff due to intraabdominal fluid collections noted on CT.  Patient awake and alert sitting in bed with no complaints at this time. Both drains c/d/i with minimal serous output- 35cc and 10cc since placement yesterday. Cultures show no organisms at this time.  Allergies: Patient has no known allergies.  Medications: Prior to Admission medications   Medication Sig Start Date End Date Taking? Authorizing Provider  naproxen sodium (ALEVE) 220 MG tablet Take 660 mg by mouth 2 (two) times daily as needed (PAIN).   Yes [provider]     Vital Signs: BP 123/73 (BP Location: Left Arm)   Pulse 98   Temp 98.8 F (37.1 C) (Oral)   Resp 17   Ht  (1.702 m)   Wt 177 lb (80.3 kg)   SpO2 100%   BMI 27.72 kg/m   Physical Exam  Constitutional: He is oriented to person, place, and time. He appears well-developed and well-nourished. No distress.  Cardiovascular: Normal rate, regular rhythm and normal heart sounds.  No murmur heard. Pulmonary/Chest: Effort normal and breath sounds normal. He has no wheezes.  Abdominal:  Midline laparotomy incision soft and beefy red. There are 2 drains (anterior and transgluteal) in place which are c/d/i without active bleeding or tenderness- both with minimal serous output.  Neurological: He is alert and oriented to person, place, and time.  Skin: Skin is warm and dry.  Psychiatric: He has a normal mood and affect. His behavior is normal. Judgment and thought content normal.    Imaging: Ct Abdomen Pelvis W Contrast  Result  Date: 08/13/2017 CLINICAL DATA:  Day 6 postop exploratory laparotomy and ileocecal ectomy for traumatic intestinal perforation. EXAM: CT ABDOMEN AND PELVIS WITH CONTRAST TECHNIQUE: Multidetector CT imaging of the abdomen and pelvis was performed using the standard protocol following bolus administration of intravenous contrast. CONTRAST:  ISOVUE-300 IOPAMIDOL (ISOVUE-300) INJECTION 61% COMPARISON:  Preop CT scan 08/07/2017 FINDINGS: Lower chest: Minimal subsegmental bibasilar atelectasis. No effusions. The heart is normal in size. Hepatobiliary: No focal hepatic lesions or intrahepatic biliary dilatation. The gallbladder is grossly normal. Some surrounding fluid is noted. No common bile duct dilatation. Pancreas: No mass, inflammation or ductal dilatation. Spleen: Normal size.  No splenic injury or mass. Adrenals/Urinary Tract: The adrenal glands and kidneys are normal. The bladder is unremarkable. Stomach/Bowel: Surgical changes in the right lower quadrant with ileocolonic anastomosis. I do not see any complicating features in this area. There are fluid-filled loops of small bowel in areas of mild bowel wall thickening but normal bowel enhancement and no findings to suggest obstruction. Minimal collection or free air is noted under the right hemidiaphragm. Vascular/Lymphatic: The aorta and branch vessels are patent. The major venous structures are patent. There are numerous small scattered mesenteric and retroperitoneal lymph nodes which are likely hyperplastic/inflammatory. Reproductive: The prostate gland and seminal vesicles are unremarkable. Other: There are several fluid collections demonstrating thin rim-like enhancement and possibly suggesting peritonitis or developing abscesses. Cul-de-sac fluid collection in the pelvis measures a maximum of 5.7 cm. Fluid collection in the suprapubic area measures a maximum of 7 cm. Small interloop fluid collections  are also noted in the left abdomen and left  pericolic gutter. Musculoskeletal: No significant bony findings. IMPRESSION: 1. Expected postoperative changes in the right lower quadrant. 2. Minimal scattered free air is noted and most likely due to the recent surgery. 3. Multiple rim enhancing fluid collections could suggest peritonitis or developing abscesses. Electronically Signed   By: Rudie Meyer M.D.   On: 08/13/2017 18:07    Labs:  CBC: Recent Labs    08/12/17 0654 08/13/17 0547 08/14/17 0443 08/15/17 0716  WBC 10.6* 12.8* 16.0* 18.4*  HGB 10.2* 9.5* 8.1* 7.8*  HCT 29.9* 27.7* 23.9* 22.7*  PLT 223 256 289 379    COAGS: Recent Labs    08/15/17 0716  INR 1.17    BMP: Recent Labs    08/08/17 0917 08/09/17 0613 08/12/17 0654 08/15/17 0716  NA 137 133* 137 137  K 4.3 4.0 4.0 4.5  CL 105 104 107 104  CO2 25 22 21* 25  GLUCOSE 128* 96 101* 93  BUN CALCIUM 8.5* 8.4* 8.6* 8.8*  CREATININE 0.98 0.85 0.67 0.66  GFRNONAA >60 >60 >60 >60  GFRAA >60 >60 >60 >60    LIVER FUNCTION TESTS: Recent Labs    08/07/17 1133  BILITOT 1.5*  AST 17  ALT 13*  ALKPHOS 72  PROT 8.3*  ALBUMIN 4.3    Assessment and Plan:  Abdominal abscess. Pelvic abscess. Anterior and transgluteal drains intact. Continue to flush every shift. IR to follow.  Electronically Signed: Elwin Mocha, PA-C 08/16/2017, 3:16 PM   I spent a total of 15 Minutes at the the patient's bedside AND on the patient's hospital floor or unit, greater than 50% of which was counseling/coordinating care for abdominal abscess and pelvic abscess.

## 2017-08-16 NOTE — Progress Notes (Signed)
Patient ID: Warwick Nick, male   DOB: 03/18/1996, 22 y.o.   MRN: [id number]    9 Days Post-Op  Subjective: Patient feels better today.  Tolerating a solid diet.  Having BMs.  Mobilizing.  Objective: Vital signs in last 24 hours: Temp:  [98.4 F (36.9 C)-99.8 F (37.7 C)] 98.4 F (36.9 C) (04/29 0500) Pulse Rate:  [84-108] 84 (04/29 0500) Resp:  [11-26] 18 (04/29 0500) BP: (103-124)/(52-83) 122/73 (04/29 0500) SpO2:  [98 %-100 %] 99 % (04/29 0500) Last BM Date: 08/14/17  Intake/Output from previous day: 04/28 0701 - 04/29 0700 In: 1460 [P.O.:240; I.V.:1205] Out: 35 [Drains:35] Intake/Output this shift: No intake/output data recorded.  PE: Heart: regular Lungs: CTAB Abd: soft midline wound is packed but with 100% beefy red granulation tissue.  2 drains in place, TG and anterior.  Both with minimal serous output.  20cc and 15cc since placement.  Lab Results:  Recent Labs    08/14/17 0443 08/15/17 0716  WBC 16.0* 18.4*  HGB 8.1* 7.8*  HCT 23.9* 22.7*  PLT 289 379   BMET Recent Labs    08/15/17 0716  NA 137  K 4.5  CL 104  CO2 25  GLUCOSE 93  BUN 11  CREATININE 0.66  CALCIUM 8.8*   PT/INR Recent Labs    08/15/17 0716  LABPROT 14.8  INR 1.17   CMP     Component Value Date/Time   NA 137 08/15/2017 0716   K 4.5 08/15/2017 0716   CL 104 08/15/2017 0716   CO2 25 08/15/2017 0716   GLUCOSE 93 08/15/2017 0716   BUN 11 08/15/2017 0716   CREATININE 0.66 08/15/2017 0716   CALCIUM 8.8 (L) 08/15/2017 0716   PROT 8.3 (H) 08/07/2017 1133   ALBUMIN 4.3 08/07/2017 1133   AST 17 08/07/2017 1133   ALT 13 (L) 08/07/2017 1133   ALKPHOS 72 08/07/2017 1133   BILITOT 1.5 (H) 08/07/2017 1133   GFRNONAA >60 08/15/2017 0716   GFRAA >60 08/15/2017 0716   Lipase     Component Value Date/Time   LIPASE 23 08/07/2017 1133       Studies/Results: No results found.  Anti-infectives: Anti-infectives (From admission, onward)   Start     Dose/Rate Route Frequency  Ordered Stop   08/07/17 2030  piperacillin-tazobactam (ZOSYN) IVPB 3.375 g  Status:  Discontinued     3.375 g 12.5 mL/hr over 240 Minutes Intravenous Every 8 hours 08/07/17 2018 08/11/17 0939   08/07/17 1415  piperacillin-tazobactam (ZOSYN) IVPB 3.375 g     3.375 g 100 mL/hr over 30 Minutes Intravenous  Once 08/07/17 1401 08/07/17 1448       Assessment/Plan Traumatic perforation of distal ileum POD#9s/pEXPLORATORY LAPAROTOMY,ILEOCECETOMY(08/07/17) Dr. Magnus Ivan -bowel functionreturned,NGT removed 4/23 -tolerating SOFT diet - mobilizeTID - IS -Pain:Scheduledtylenol and ibuprofen, PRN robaxin, PRN oxy, PRN morphine. -2 perc drains placed 4-28 by IR.  CX show no organisms so far  Leukocytosis - 18K yesterday, repeat CBC in am Fever - essentially resolved  MVH:QION diet,CBC in am.  SLIV ID: Zosyn 4/20-4/24; CX show no organisms, will d/w MD to determine if any additional abx needed at this time. VTE: SCD's, Lovenox  Foley:none  Dispo: follow leukocytosis.  Hopefully home.  Will set up [facility] for dressing changes and drain management.    LOS: 9 days    Letha Cape , [facility] Surgery 08/16/2017, 8:28 AM Pager: [phone]

## 2017-08-17 ENCOUNTER — Other Ambulatory Visit: Payer: Self-pay | Admitting: Physician Assistant

## 2017-08-17 DIAGNOSIS — R188 Other ascites: Secondary | ICD-10-CM

## 2017-08-17 LAB — CBC
HEMATOCRIT: 22.3 % — AB (ref 39.0–52.0)
HEMOGLOBIN: 7.5 g/dL — AB (ref 13.0–17.0)
MCH: 28.7 pg (ref 26.0–34.0)
MCHC: 33.6 g/dL (ref 30.0–36.0)
MCV: 85.4 fL (ref 78.0–100.0)
Platelets: 545 10*3/uL — ABNORMAL HIGH (ref 150–400)
RBC: 2.61 MIL/uL — AB (ref 4.22–5.81)
RDW: 14.2 % (ref 11.5–15.5)
WBC: 17.1 10*3/uL — AB (ref 4.0–10.5)

## 2017-08-17 MED ORDER — ACETAMINOPHEN 500 MG PO TABS: 1000.0000 mg | ORAL_TABLET | Freq: Four times a day (QID) | ORAL | 0 refills | Status: DC | PRN

## 2017-08-17 MED ORDER — IBUPROFEN 400 MG PO TABS: 400.0000 mg | ORAL_TABLET | Freq: Three times a day (TID) | ORAL | 0 refills | Status: DC | PRN

## 2017-08-17 MED ORDER — OXYCODONE HCL 5 MG PO TABS: 5.0000 mg | ORAL_TABLET | Freq: Four times a day (QID) | ORAL | 0 refills | Status: DC | PRN

## 2017-08-17 NOTE — Discharge Instructions (Signed)

## 2017-08-17 NOTE — Care Management Note (Signed)
Case Management Note  Patient Details  Name: Warren Pennington MRN: [id number] Date of Birth: 1995-07-24  Subjective/Objective:   Pt admitted on 08/07/17 s/p blunt trauma to abdomen with perforated viscus and peritonitis.  Pt s/p exploratory laparotomy with ileocecectomy on 08/07/17.  PTA, pt independent of ADLS, has supportive parents.                 Action/Plan: Will follow for discharge planning as pt progresses.  Will likely need HHRN for wound management at discharge.  Will follow.    Expected Discharge Date:  08/17/17               Expected Discharge Plan:  Home w Home Health Services  In-House Referral:  Clinical Social Work  Discharge planning Services  CM Consult  Post Acute Care Choice:    Choice offered to:     DME Arranged:    DME Agency:     HH Arranged:  RN HH Agency:  [facility]  Status of Service:  Completed, signed off  If discussed at Long Length of Stay Meetings, dates discussed:    Additional Comments:  08/13/17 J. Jelan Batterton, RN, BSN Confirmed with Wyline Beady, [facility] Case Manager with [facility], that [facility] has been arranged through [facility].  Per Tiffany, should pt discharge over the weekend, notify Andrey Campanile, Environmental education officer with [facility] agency (phone: [phone]).  08/17/17 J. Clanton Emanuelson, RN, BSN Pt medically stable for discharge home today with family.  He states he is feeling much better.  He will dc home with 2 percutaneous drains.  Updated HHRN wound and drain care orders faxed to [facility] case manager, Tiffany, with  DC summary.  [facility] to follow up with pt at home, per [facility] arrangements.  Start of care next 24h.    Quintella Baton, RN, BSN  Trauma/Neuro ICU Case Manager [phone]

## 2017-08-17 NOTE — Progress Notes (Signed)
Patient discharged to home. Verbalizes understanding of all discharge instructions including incision care, discharge medications, and follow up MD visits. Patient accompanied by mother and sister.

## 2017-08-17 NOTE — Discharge Summary (Signed)
Patient ID: Warren Pennington [id number] Jan 24, 1996 22 y.o.  Admit date: 08/07/2017 Discharge date: 08/17/2017  Admitting Diagnosis: Perforated viscus  Discharge Diagnosis Patient Active Problem List   Diagnosis Date Noted  . Perforated viscus 08/07/2017    Consultants IR  Reason for Admission: Pt is a 22 yo M who had a work related incident 2 days ago.  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  He was sore, but managed to finish out his shift.  He figured he would feel better by the next day.  The next day he continued to feel bad and progressively worsened over the past 24 hours.  He has gotten flushed and started having chills and quick passing gas.  His family made him come to the emergency department.  He is not having nausea.  He describes the pain as severe.  The pain is worse with palpation and trying to get on and off of the bed.  He has had no improvement with naproxen.   Procedures Exploratory laparotomy with ileocecectomy by Dr. Magnus Ivan on 08-07-17  Hospital Course:  The patient was admitted and taken to the OR where he was found to have a perforation at the level of the terminal ileum.  The above procedure was completed.  He had an NGT placed for post op ileus management.  His bowel function returned fairly quickly and on POD 3 his NGT was discontinued and he was placed on a diet.  His diet was able to be advanced as tolerated over the next several days.  He did have significant diarrhea and he was placed on imodium which resolved this issue.  He did develop some fevers and an elevation of his WBC to 18K.  A CT scan was ordered on POD 6 that revealed 2 fluid collections.  IR was consulted and a transgluteal and RLQ anterior drain were placed.  These cultures were negative and his fevers resolved.  He was treated with zosyn for 4 days, but this was felt satisfactory.  It was not restarted after drain placement secondary to  sterile cultures.  On POD 10, the patient was tolerating a regular diet, good bowel movement control, minimal serous fluid from his drains, and good pain control.  He was felt stable for DC home with [facility] services for NS WD dressing changes to his open midline wound as well as routine drain care and flushes.    Physical Exam: Gen: NAD Heart: regular LungS: CTAB Abd: soft, minimally tender, midline wound is clean and packed.  Both drains with minimal serous fluid, only 20cc total in last 24hrs between the two.  Allergies as of 08/17/2017   No Known Allergies     Medication List    TAKE these medications   acetaminophen 500 MG tablet Commonly known as:  TYLENOL Take 2 tablets (1,000 mg total) by mouth every 6 (six) hours as needed.   ibuprofen 400 MG tablet Commonly known as:  ADVIL,MOTRIN Take 1-2 tablets (400-800 mg total) by mouth every 8 (eight) hours as needed.   naproxen sodium 220 MG tablet Commonly known as:  ALEVE Take 660 mg by mouth 2 (two) times daily as needed (PAIN).   oxyCODONE 5 MG immediate release tablet Commonly known as:  Oxy IR/ROXICODONE Take 1 tablet (5 mg total) by mouth every 6 (six) hours as needed for moderate pain.        Follow-up Information    [facility]. Go on 08/24/2017.   Why:  Your follow up appointment is scheduled for 9:00 AM. Please arrive 30 min prior to appointment time. Bring photo ID and insurance information.  Contact information: [address] [place] [address] [phone]       Care, [facility] Follow up.   Specialty:  Home Health Services Why:  Home health nurse to follow up with you at discharge for wound care Contact information: [address] [place] [address] [phone]        Jolaine Click, MD Follow up in 1 week(s).   Specialties:  Interventional Radiology, Radiology Why:  Their office will call you with an appointment date and time Contact  information: [address] [place] [address] [phone]           Signed: Barnetta Chapel, [facility] Surgery 08/17/2017, 8:46 AM Pager: [phone]

## 2017-08-20 LAB — AEROBIC/ANAEROBIC CULTURE W GRAM STAIN (SURGICAL/DEEP WOUND)
Culture: NO GROWTH
Culture: NO GROWTH

## 2017-08-20 LAB — AEROBIC/ANAEROBIC CULTURE (SURGICAL/DEEP WOUND)

## 2017-08-24 ENCOUNTER — Ambulatory Visit
Admission: RE | Admit: 2017-08-24 | Discharge: 2017-08-24 | Disposition: A | Discharge status: Home / Self Care | Payer: BLUE CROSS/BLUE SHIELD | Source: Ambulatory Visit | Attending: Student | Admitting: Student

## 2017-08-24 ENCOUNTER — Ambulatory Visit
Admission: RE | Admit: 2017-08-24 | Discharge: 2017-08-24 | Disposition: A | Discharge status: Home / Self Care | Payer: BLUE CROSS/BLUE SHIELD | Source: Ambulatory Visit | Attending: Physician Assistant | Admitting: Physician Assistant

## 2017-08-24 ENCOUNTER — Encounter: Payer: Self-pay | Admitting: Radiology

## 2017-08-24 DIAGNOSIS — R188 Other ascites: Secondary | ICD-10-CM

## 2017-08-24 HISTORY — PX: IR RADIOLOGIST EVAL & MGMT: IMG5224

## 2017-08-24 MED ORDER — IOPAMIDOL (ISOVUE-300) INJECTION 61%
100.0000 mL | Freq: Once | INTRAVENOUS | Status: AC | PRN
  Administered 2017-08-24: 100 mL via INTRAVENOUS

## 2017-08-24 NOTE — Progress Notes (Addendum)
Referring Physician(s): Blackman,D  Chief Complaint: The patient is seen in follow up today s/p drainage of lower abdominal and pelvic fluid collections on 08/17/2017  History of present illness: Warren Pennington is a 22 year old white male with history of work injury involving 2 x 4 to the mid abdominal region and subsequent terminal ileum perforation with ileocecectomy performed on 08/07/17.  Later patient developed fluid collections in the lower abdominal and pelvic regions and underwent CT-guided drainage of both collections on 08/17/2017.  He presents today for follow-up CT and drain assessment.  He currently denies fever, chest pain, nausea, vomiting or bleeding.  He is on a normal diet and currently not on antibiotic therapy.  Previous fluid cultures from drained collections were negative.  He does report some mild tenderness at drain insertion sites.  He is flushing each drain once daily.  Output has been minimal.  He is having normal bowel movements.   Past Medical History:  Diagnosis Date  . Blunt injury of abdomen 08/05/2017   "hit w/2X4 @ work"  . GERD (gastroesophageal reflux disease)     Past Surgical History:  Procedure Laterality Date  . APPENDECTOMY  08/07/2017  . BOWEL RESECTION N/A 08/07/2017   Procedure: SMALL BOWEL RESECTION;  Surgeon: Abigail Miyamoto, MD;  Location: [facility] OR;  Service: General;  Laterality: N/A;  . COLON SURGERY    . ILEOCECETOMY N/A 08/07/2017   Procedure: Ferne Coe;  Surgeon: Abigail Miyamoto, MD;  Location: [facility] OR;  Service: General;  Laterality: N/A;  . LAPAROTOMY N/A 08/07/2017   Procedure: EXPLORATORY LAPAROTOMY;  Surgeon: Abigail Miyamoto, MD;  Location: [facility] OR;  Service: General;  Laterality: N/A;    Allergies: Patient has no known allergies.  Medications: Prior to Admission medications   Medication Sig Start Date End Date Taking? Authorizing Provider  acetaminophen (TYLENOL) 500 MG tablet Take 2 tablets (1,000 mg total) by mouth every 6  (six) hours as needed. 08/17/17   Barnetta Chapel, PA-C  ibuprofen (ADVIL,MOTRIN) 400 MG tablet Take 1-2 tablets (400-800 mg total) by mouth every 8 (eight) hours as needed. 08/17/17   Barnetta Chapel, PA-C  naproxen sodium (ALEVE) 220 MG tablet Take 660 mg by mouth 2 (two) times daily as needed (PAIN).    [provider]  oxyCODONE (OXY IR/ROXICODONE) 5 MG immediate release tablet Take 1 tablet (5 mg total) by mouth every 6 (six) hours as needed for moderate pain. 08/17/17   Barnetta Chapel, PA-C     No family history on file.  Social History   Socioeconomic History  . Marital status: Single    Spouse name: Not on file  . Number of children: Not on file  . Years of education: Not on file  . Highest education level: Not on file  Occupational History  . Not on file  Social Needs  . Financial resource strain: Not on file  . Food insecurity:    Worry: Not on file    Inability: Not on file  . Transportation needs:    Medical: Not on file    Non-medical: Not on file  Tobacco Use  . Smoking status: Current Every Day Smoker    Packs/day: 1.00    Years: 5.00    Pack years: 5.00    Types: Cigarettes  . Smokeless tobacco: Never Used  Substance and Sexual Activity  . Alcohol use: Yes    Comment: 08/09/2017 "6 beers/month"  . Drug use: Not Currently  . Sexual activity: Yes  Lifestyle  . Physical activity:  Days per week: Not on file    Minutes per session: Not on file  . Stress: Not on file  Relationships  . Social connections:    Talks on phone: Not on file    Gets together: Not on file    Attends religious service: Not on file    Active member of club or organization: Not on file    Attends meetings of clubs or organizations: Not on file    Relationship status: Not on file  Other Topics Concern  . Not on file  Social History Narrative  . Not on file     Vital Signs: There were no vitals filed for this visit.   Physical Exam awake, alert.  Chest clear to  auscultation bilaterally.  Heart with regular rate and rhythm.  Abdomen soft, midline wound dressing intact, anterior abdominal and transgluteal drains intact with small amount of yellow/blood-tinged fluid in JP bulbs.  Imaging: No results found.  Labs:  CBC: Recent Labs    08/13/17 0547 08/14/17 0443 08/15/17 0716 08/17/17 0352  WBC 12.8* 16.0* 18.4* 17.1*  HGB 9.5* 8.1* 7.8* 7.5*  HCT 27.7* 23.9* 22.7* 22.3*  PLT 256 289 379 545*    COAGS: Recent Labs    08/15/17 0716  INR 1.17    BMP: Recent Labs    08/08/17 0917 08/09/17 0613 08/12/17 0654 08/15/17 0716  NA 137 133* 137 137  K 4.3 4.0 4.0 4.5  CL 105 104 107 104  CO2 25 22 21* 25  GLUCOSE 128* 96 101* 93  BUN CALCIUM 8.5* 8.4* 8.6* 8.8*  CREATININE 0.98 0.85 0.67 0.66  GFRNONAA >60 >60 >60 >60  GFRAA >60 >60 >60 >60    LIVER FUNCTION TESTS: Recent Labs    08/07/17 1133  BILITOT 1.5*  AST 17  ALT 13*  ALKPHOS 72  PROT 8.3*  ALBUMIN 4.3    Assessment: 22 year old male, status post blunt injury to abdomen with 2 x 4 necessitating ileocecectomy on 08/07/2017 for perforated terminal ileum.  Status post drainage of lower abdominal and pelvic fluid collections on 08/17/2017.  Patient currently stable and without fever, significant abdominal pain, nausea or vomiting.  Minimal output from both drains.  Follow-up CT today revealed resolution of previously drained fluid collections with no significant residual or new collections.  Follow-up drain injections revealed no communication to bowel.  Both abdominal drains were removed in their entirety without immediate complications.  Gauze dressings applied over insertion sites.  Site care reviewed with patient.   Signed: D. Jeananne Rama, PA-C 08/24/2017, 2:46 PM   Please refer to Dr. Manon Hilding attestation of this note for management and plan.      Patient ID: Warren Pennington, male   DOB: [birth year], 22 y.o.   MRN: [id number]

## 2022-05-28 ENCOUNTER — Ambulatory Visit: Payer: 59 | Admitting: Family Medicine

## 2022-06-04 ENCOUNTER — Encounter: Payer: Self-pay | Admitting: Family Medicine

## 2022-06-08 ENCOUNTER — Encounter: Payer: Self-pay | Admitting: Family Medicine

## 2022-06-08 ENCOUNTER — Ambulatory Visit: Payer: 59 | Admitting: Family Medicine

## 2022-06-08 VITALS — BP 128/100 | HR 95 | Temp 99.0°F | Ht 67.0 in | Wt 259.0 lb

## 2022-06-08 DIAGNOSIS — Z1159 Encounter for screening for other viral diseases: Secondary | ICD-10-CM

## 2022-06-08 DIAGNOSIS — R030 Elevated blood-pressure reading, without diagnosis of hypertension: Secondary | ICD-10-CM | POA: Diagnosis not present

## 2022-06-08 DIAGNOSIS — R0683 Snoring: Secondary | ICD-10-CM | POA: Diagnosis not present

## 2022-06-08 DIAGNOSIS — Z114 Encounter for screening for human immunodeficiency virus [HIV]: Secondary | ICD-10-CM | POA: Diagnosis not present

## 2022-06-08 DIAGNOSIS — E66813 Obesity, class 3: Secondary | ICD-10-CM | POA: Insufficient documentation

## 2022-06-08 DIAGNOSIS — Z6841 Body Mass Index (BMI) 40.0 and over, adult: Secondary | ICD-10-CM

## 2022-06-08 DIAGNOSIS — F17210 Nicotine dependence, cigarettes, uncomplicated: Secondary | ICD-10-CM

## 2022-06-08 DIAGNOSIS — Z0000 Encounter for general adult medical examination without abnormal findings: Secondary | ICD-10-CM

## 2022-06-08 DIAGNOSIS — Z0001 Encounter for general adult medical examination with abnormal findings: Secondary | ICD-10-CM

## 2022-06-08 HISTORY — DX: Encounter for general adult medical examination without abnormal findings: Z00.00

## 2022-06-08 LAB — CBC WITH DIFFERENTIAL/PLATELET
Basophils Absolute: 76 cells/uL (ref 0–200)
Eosinophils Absolute: 162 cells/uL (ref 15–500)
Lymphs Abs: 3456 cells/uL (ref 850–3900)
MPV: 11.9 fL (ref 7.5–12.5)
Monocytes Relative: 7.3 %
Neutro Abs: 6318 cells/uL (ref 1500–7800)
WBC: 10.8 10*3/uL (ref 3.8–10.8)

## 2022-06-08 MED ORDER — NICOTINE 14 MG/24HR TD PT24: 14.0000 mg | MEDICATED_PATCH | Freq: Every day | TRANSDERMAL | 0 refills | Status: DC

## 2022-06-08 MED ORDER — NICOTINE 7 MG/24HR TD PT24: 7.0000 mg | MEDICATED_PATCH | Freq: Every day | TRANSDERMAL | 0 refills | Status: DC

## 2022-06-08 NOTE — Addendum Note (Signed)
Addended by: Mila Merry S on: 06/08/2022 04:00 PM   Modules accepted: Level of Service

## 2022-06-08 NOTE — Patient Instructions (Addendum)
My Fitness Pal app Purchase blood pressure cuff and monitor a few times weekly. Report to office in 1-2 weeks via [place].   It was great to meet you today and I'm excited to have you join the [place] practice. I hope you had a positive experience today! If you feel so inclined, please feel free to recommend our practice to friends and family. Mila Merry, FNP-C

## 2022-06-08 NOTE — Assessment & Plan Note (Signed)
BP elevated in office today after coffee this AM. Recommend obtaining BP cuff and checking at home before coffee, exercise, or smoking. He is motivated to lose weight and quit smoking. Recommended heart healthy diet and 150 minutes weekly of moderate intensity exercise. Denies chest pain, shortness of breath, dizziness/lightheadedness, palpitations, headaches, vision changes, swelling of extremities and instructed to seek medical care for any of these. Will report home BP readings to office via [place] and return to office sooner if sustained >140/90

## 2022-06-08 NOTE — Assessment & Plan Note (Signed)
Ready to quit. Cessation counseling provided. Start 62m Nicotine patches for 6 weeks followed by 767mfor 2 weeks.

## 2022-06-08 NOTE — Assessment & Plan Note (Signed)

## 2022-06-08 NOTE — Assessment & Plan Note (Addendum)
Reports he consumes excess calories, recommended My Fitness Pal to monitor calorie consumption and set goals. Referral to [facility] placed. Encouraged heart healthy, reduced calorie diet and 150 minutes moderate intensity exercise weekly. He does have concerns he may have sleep apnea, reported night wakings by wife, he does snore. Referral to sleep studies placed.

## 2022-06-08 NOTE — Progress Notes (Signed)
New Patient Office Visit  Subjective    Patient ID: Warren Pennington, male    DOB: [birth year]  Age: 27 y.o. MRN: [id number]  CC: No chief complaint on file.   HPI Warren Pennington presents to establish care. Oriented to practice routines and expectations. Has not been seeing a PCP recently. Concerns today include sleep apnea, weight loss, smoking cessation.  Tobacco: 1/2-1ppb, ready to quit STI: declines Vaccines: declines Covid, unsure HPV (will [facility]) BMI 40.57   Outpatient Encounter Medications as of 06/08/2022  Medication Sig   acetaminophen (TYLENOL) 500 MG tablet Take 2 tablets (1,000 mg total) by mouth every 6 (six) hours as needed.   ibuprofen (ADVIL,MOTRIN) 400 MG tablet Take 1-2 tablets (400-800 mg total) by mouth every 8 (eight) hours as needed.   [START ON 07/20/2022] nicotine (NICODERM CQ - DOSED IN MG/24 HR) 7 mg/24hr patch Place 1 patch (7 mg total) onto the skin daily.   nicotine (NICOTINE STEP 2) 14 mg/24hr patch Place 1 patch (14 mg total) onto the skin daily.   [DISCONTINUED] naproxen sodium (ALEVE) 220 MG tablet Take 660 mg by mouth 2 (two) times daily as needed (PAIN). (Patient not taking: Reported on 06/08/2022)   [DISCONTINUED] oxyCODONE (OXY IR/ROXICODONE) 5 MG immediate release tablet Take 1 tablet (5 mg total) by mouth every 6 (six) hours as needed for moderate pain. (Patient not taking: Reported on 06/08/2022)   No facility-administered encounter medications on file as of 06/08/2022.    Past Medical History:  Diagnosis Date   Blunt injury of abdomen 08/05/2017   "hit w/2X4 @ work"   GERD (gastroesophageal reflux disease)    Physical exam, annual 06/08/2022    Past Surgical History:  Procedure Laterality Date   APPENDECTOMY  08/07/2017   BOWEL RESECTION N/A 08/07/2017   Procedure: SMALL BOWEL RESECTION;  Surgeon: Coralie Keens, MD;  Location: [place];  Service: General;  Laterality: N/A;   COLON SURGERY     ILEOCECETOMY N/A 08/07/2017    Procedure: Pennie Rushing;  Surgeon: Coralie Keens, MD;  Location: [place];  Service: General;  Laterality: N/A;   IR RADIOLOGIST EVAL & MGMT  08/24/2017   LAPAROTOMY N/A 08/07/2017   Procedure: EXPLORATORY LAPAROTOMY;  Surgeon: Coralie Keens, MD;  Location: [place];  Service: General;  Laterality: N/A;    No family history on file.  Social History   Socioeconomic History   Marital status: Married    Spouse name: Not on file   Number of children: Not on file   Years of education: Not on file   Highest education level: Not on file  Occupational History   Not on file  Tobacco Use   Smoking status: Every Day    Packs/day: 1.00    Years: 5.00    Total pack years: 5.00    Types: Cigarettes   Smokeless tobacco: Never  Vaping Use   Vaping Use: Never used  Substance and Sexual Activity   Alcohol use: Yes    Comment: 08/09/2017 "6 beers/month"   Drug use: Not Currently   Sexual activity: Yes    Birth control/protection: Condom  Other Topics Concern   Not on file  Social History Narrative   Not on file   Social Determinants of Health   Financial Resource Strain: Not on file  Food Insecurity: Not on file  Transportation Needs: Not on file  Physical Activity: Not on file  Stress: Not on file  Social Connections: Not on file  Intimate Partner Violence: Not  on file    Review of Systems  Constitutional: Negative.   HENT: Negative.    Eyes: Negative.   Respiratory: Negative.    Cardiovascular: Negative.   Gastrointestinal:  Positive for diarrhea.  Genitourinary: Negative.   Musculoskeletal: Negative.   Skin: Negative.   Neurological: Negative.   Endo/Heme/Allergies: Negative.   Psychiatric/Behavioral: Negative.    All other systems reviewed and are negative.       Objective    BP (!) 128/100   Pulse 95   Temp 99 F (37.2 C) (Oral)   Ht 5' 7"$  (1.702 m)   Wt 259 lb (117.5 kg)   SpO2 98%   BMI 40.57 kg/m   Physical Exam Vitals and nursing note reviewed.   Constitutional:      Appearance: Normal appearance. He is normal weight.  HENT:     Head: Normocephalic and atraumatic.     Right Ear: Tympanic membrane, ear canal and external ear normal.     Left Ear: Tympanic membrane, ear canal and external ear normal.     Nose: Nose normal.     Mouth/Throat:     Mouth: Mucous membranes are moist.     Pharynx: Oropharynx is clear.  Eyes:     Extraocular Movements: Extraocular movements intact.     Right eye: Normal extraocular motion and no nystagmus.     Left eye: Normal extraocular motion and no nystagmus.     Conjunctiva/sclera: Conjunctivae normal.     Pupils: Pupils are equal, round, and reactive to light.  Cardiovascular:     Rate and Rhythm: Normal rate and regular rhythm.     Pulses: Normal pulses.     Heart sounds: Normal heart sounds.  Pulmonary:     Effort: Pulmonary effort is normal.     Breath sounds: Normal breath sounds.  Abdominal:     General: Bowel sounds are normal.     Palpations: Abdomen is soft.  Genitourinary:    Comments: Deferred using shared decision making Musculoskeletal:        General: Normal range of motion.     Cervical back: Normal range of motion and neck supple.  Skin:    General: Skin is warm and dry.     Capillary Refill: Capillary refill takes less than 2 seconds.  Neurological:     General: No focal deficit present.     Mental Status: He is alert. Mental status is at baseline.  Psychiatric:        Mood and Affect: Mood normal.        Speech: Speech normal.        Behavior: Behavior normal.        Thought Content: Thought content normal.        Cognition and Memory: Cognition and memory normal.        Judgment: Judgment normal.         Assessment & Plan:   Problem List Items Addressed This Visit       Other   Physical exam, annual - Primary    Today your medical history was reviewed and routine physical exam with labs was performed. Recommend 150 minutes of moderate intensity  exercise weekly and consuming a well-balanced diet. Advised to stop smoking if a smoker, avoid smoking if a non-smoker, limit alcohol consumption to 1 drink per day for women and 2 drinks per day for men, and avoid illicit drug use. Counseled on safe sex practices and offered STI testing today. Counseled on the importance  of sunscreen use. Counseled in mental health awareness and when to seek medical care. Vaccine maintenance discussed. Appropriate health maintenance items reviewed. Return to office in 1 year for annual physical exam.       Relevant Orders   CBC with Differential/Platelet   COMPLETE METABOLIC PANEL WITH GFR   Lipid panel   TSH   Class 3 severe obesity due to excess calories with body mass index (BMI) of 40.0 to 44.9 in adult [facility])    Reports he consumes excess calories, recommended My Fitness Pal to monitor calorie consumption and set goals. Referral to MWM placed. Encouraged heart healthy, reduced calorie diet and 150 minutes moderate intensity exercise weekly. He does have concerns he may have sleep apnea, reported night wakings by wife, he does snore. Referral to sleep studies placed.      Relevant Orders   CBC with Differential/Platelet   COMPLETE METABOLIC PANEL WITH GFR   Lipid panel   Ambulatory referral to Sleep Studies   Amb Ref to Medical Weight Management   TSH   Tobacco dependence due to cigarettes    Ready to quit. Cessation counseling provided. Start 67m Nicotine patches for 6 weeks followed by 743mfor 2 weeks.      Relevant Medications   nicotine (NICOTINE STEP 2) 14 mg/24hr patch   nicotine (NICODERM CQ - DOSED IN MG/24 HR) 7 mg/24hr patch (Start on 07/20/2022)   Elevated blood pressure reading in office without diagnosis of hypertension    BP elevated in office today after coffee this AM. Recommend obtaining BP cuff and checking at home before coffee, exercise, or smoking. He is motivated to lose weight and quit smoking. Recommended heart healthy diet and  150 minutes weekly of moderate intensity exercise. Denies chest pain, shortness of breath, dizziness/lightheadedness, palpitations, headaches, vision changes, swelling of extremities and instructed to seek medical care for any of these. Will report home BP readings to office via [place] return to office sooner if sustained >140/90      Other Visit Diagnoses     Screening for HIV (human immunodeficiency virus)       Relevant Orders   HIV Antibody (routine testing w rflx)   Need for hepatitis C screening test       Relevant Orders   Hepatitis C antibody   Snoring       Relevant Orders   Ambulatory referral to Sleep Studies       Return in about 4 weeks (around 07/06/2022) for BP follow-up.   AmRubie MaidFNP

## 2022-06-09 LAB — COMPLETE METABOLIC PANEL WITH GFR
AG Ratio: 1.7 (calc) (ref 1.0–2.5)
ALT: 32 U/L (ref 9–46)
AST: 19 U/L (ref 10–40)
Albumin: 4.5 g/dL (ref 3.6–5.1)
Alkaline phosphatase (APISO): 54 U/L (ref 36–130)
BUN: 16 mg/dL (ref 7–25)
CO2: 23 mmol/L (ref 20–32)
Calcium: 9.9 mg/dL (ref 8.6–10.3)
Chloride: 108 mmol/L (ref 98–110)
Creat: 0.94 mg/dL (ref 0.60–1.24)
Globulin: 2.7 g/dL (calc) (ref 1.9–3.7)
Glucose, Bld: 98 mg/dL (ref 65–99)
Potassium: 4.4 mmol/L (ref 3.5–5.3)
Sodium: 142 mmol/L (ref 135–146)
Total Bilirubin: 0.3 mg/dL (ref 0.2–1.2)
Total Protein: 7.2 g/dL (ref 6.1–8.1)
eGFR: 115 mL/min/{1.73_m2} (ref >=60)

## 2022-06-09 LAB — CBC WITH DIFFERENTIAL/PLATELET
Absolute Monocytes: 788 cells/uL (ref 200–950)
Basophils Relative: 0.7 %
Eosinophils Relative: 1.5 %
HCT: 43.8 % (ref 38.5–50.0)
Hemoglobin: 15.2 g/dL (ref 13.2–17.1)
MCH: 28.4 pg (ref 27.0–33.0)
MCHC: 34.7 g/dL (ref 32.0–36.0)
MCV: 81.7 fL (ref 80.0–100.0)
Neutrophils Relative %: 58.5 %
Platelets: 241 10*3/uL (ref 140–400)
RBC: 5.36 10*6/uL (ref 4.20–5.80)
RDW: 13.3 % (ref 11.0–15.0)
Total Lymphocyte: 32 %

## 2022-06-09 LAB — HIV ANTIBODY (ROUTINE TESTING W REFLEX): HIV 1&2 Ab, 4th Generation: NONREACTIVE

## 2022-06-09 LAB — LIPID PANEL
Cholesterol: 178 mg/dL (ref <200)
HDL: 33 mg/dL — ABNORMAL LOW (ref >=40)
Non-HDL Cholesterol (Calc): 145 mg/dL (calc) — ABNORMAL HIGH (ref <130)
Total CHOL/HDL Ratio: 5.4 (calc) — ABNORMAL HIGH (ref <5.0)
Triglycerides: 414 mg/dL — ABNORMAL HIGH (ref <150)

## 2022-06-09 LAB — HEPATITIS C ANTIBODY: Hepatitis C Ab: NONREACTIVE

## 2022-06-09 LAB — TSH: TSH: 1.88 mIU/L (ref 0.40–4.50)

## 2022-06-16 ENCOUNTER — Other Ambulatory Visit: Payer: Self-pay

## 2022-06-17 ENCOUNTER — Ambulatory Visit: Payer: 59 | Admitting: Family Medicine

## 2022-06-21 ENCOUNTER — Encounter: Payer: Self-pay | Admitting: Family Medicine

## 2022-06-23 ENCOUNTER — Encounter: Payer: Self-pay | Admitting: Neurology

## 2022-06-23 ENCOUNTER — Ambulatory Visit: Payer: 59 | Admitting: Neurology

## 2022-06-23 VITALS — BP 136/94 | HR 86 | Ht 67.0 in | Wt 252.2 lb

## 2022-06-23 DIAGNOSIS — G4719 Other hypersomnia: Secondary | ICD-10-CM

## 2022-06-23 DIAGNOSIS — Z9189 Other specified personal risk factors, not elsewhere classified: Secondary | ICD-10-CM | POA: Diagnosis not present

## 2022-06-23 DIAGNOSIS — R0683 Snoring: Secondary | ICD-10-CM

## 2022-06-23 DIAGNOSIS — R030 Elevated blood-pressure reading, without diagnosis of hypertension: Secondary | ICD-10-CM

## 2022-06-23 DIAGNOSIS — R0681 Apnea, not elsewhere classified: Secondary | ICD-10-CM

## 2022-06-23 DIAGNOSIS — Z820 Family history of epilepsy and other diseases of the nervous system: Secondary | ICD-10-CM

## 2022-06-23 DIAGNOSIS — E669 Obesity, unspecified: Secondary | ICD-10-CM

## 2022-06-23 NOTE — Patient Instructions (Addendum)

## 2022-06-23 NOTE — Progress Notes (Signed)
Subjective:    Patient ID: Warren Pennington is a 27 y.o. male.  HPI    Star Age, MD, PhD [facility] [address], [address]O. [place], [place] [address]  Dear Luetta Nutting,  I saw your patient, Warren Pennington, upon your kind request in my sleep clinic today for initial consultation of his sleep disorder, in particular, concern for underlying obstructive sleep apnea.  The patient is unaccompanied today.  As you know, Warren Pennington is a 27 year old male with an underlying medical history of reflux disease, blunt abdominal injury in 2019 requiring extensive surgeries, smoking, and obesity, who reports snoring and excessive daytime somnolence, as well as witnessed apneas, per wife's report.  His Epworth sleepiness score is 14 out of 24, fatigue severity score is 35 out of 63.  I reviewed your office note from 06/08/2022.  His blood pressure was elevated at the time.  Smoking cessation was discussed.  He was advised to monitor his blood pressure.  His blood pressure tends to be a little bit better at home, sometimes normal, he has been monitoring it first thing in the morning.  He goes to bed around 8 and rise time is around 5.  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  He is currently working for [employer].  He lives at home with his wife and 19-monthold son, his son sleeps in his own room.  They have outdoor cats, no indoor pets.  He denies night to night nocturia or recurrent nocturnal or morning headaches.  His dad had sleep apnea and died at 619with multiple medical complications including congestive heart failure.  Patient drinks caffeine in the form of coffee, 1 or 2 large cups per day.  He is working on smoking cessation, still smokes about a pack per day.  He drinks alcohol about 1 or 2 drinks per week.  He stopped smoking marijuana in December 2023.   He has a TV in his bedroom but does not watch it at night.  His snoring can be loud per wife and mom.  His Past Medical History Is Significant For: Past Medical History:  Diagnosis Date   Blunt injury of abdomen 08/05/2017   "hit w/2X4 @ work"   GERD (gastroesophageal reflux disease)    Physical exam, annual 06/08/2022    His Past Surgical History Is Significant For: Past Surgical History:  Procedure Laterality Date   APPENDECTOMY  08/07/2017   BOWEL RESECTION N/A 08/07/2017   Procedure: SMALL BOWEL RESECTION;  Surgeon: BCoralie Keens MD;  [place] [place]  Service: General;  Laterality: N/A;   COLON SURGERY     ILEOCECETOMY N/A 08/07/2017   Procedure: IPennie Rushing  Surgeon: BCoralie Keens MD;  [place] [facility]  Service: General;  Laterality: N/A;   IR RADIOLOGIST EVAL & MGMT  08/24/2017   LAPAROTOMY N/A 08/07/2017   Procedure: EXPLORATORY LAPAROTOMY;  Surgeon: BCoralie Keens MD;  [place] [facility]  Service: General;  Laterality: N/A;    His Family History Is Significant For: Family History  Problem Relation Age of Onset   Heart failure Father     His Social History Is Significant For: Social History   Socioeconomic History   Marital status: Married    Spouse name: Not on file   Number of children: Not on file   Years of education: Not on file   Highest education level: Not on  file  Occupational History   Not on file  Tobacco Use   Smoking status: Every Day    Packs/day: 1.00    Years: 5.00    Total pack years: 5.00    Types: Cigarettes   Smokeless tobacco: Never  Vaping Use   Vaping Use: Never used  Substance and Sexual Activity   Alcohol use: Yes    Comment: 1-2 week   Drug use: Not Currently    Comment: quit 04-18-2022   Sexual activity: Yes    Birth control/protection: Condom  Other Topics Concern   Not on file  Social History Narrative   Caffiene 1-2 drinks per day.   Lives at home with wife and son.   Works at [employer] Strain: Not on file  Food Insecurity: Not on file  Transportation Needs: Not on file  Physical Activity: Not on file  Stress: Not on file  Social Connections: Not on file    His Allergies Are:  No Known Allergies:   His Current Medications Are:  Outpatient Encounter Medications as of 06/23/2022  Medication Sig   [START ON 07/20/2022] nicotine (NICODERM CQ - DOSED IN MG/24 HR) 7 mg/24hr patch Place 1 patch (7 mg total) onto the skin daily. (Patient not taking: Reported on 06/23/2022)   nicotine (NICOTINE STEP 2) 14 mg/24hr patch Place 1 patch (14 mg total) onto the skin daily.   No facility-administered encounter medications on file as of 06/23/2022.  :   Review of Systems:  Out of a complete 14 point review of systems, all are reviewed and negative with the exception of these symptoms as listed below:  Review of Systems  Neurological:        Snoring, night time wakening, obesity.  Daytime sleepiness, witness apnea.  ESS  - BMI 40.  ESS 14   FSS  35.    Objective:  Neurological Exam  Physical Exam Physical Examination:   Vitals:   06/23/22 0907 06/23/22 0914  BP: (!) 151/101 (!) 136/94  Pulse: 96 86    General Examination: The patient is a very pleasant 27 y.o. male in no acute distress. He appears well-developed and well-nourished and well groomed.  Blood pressure is elevated, slightly better upon recheck.  He has no symptoms such as blurry vision, chest pain, shortness of breath or headache.  HEENT: Normocephalic, atraumatic, pupils are equal, round and reactive to light, extraocular tracking is good without limitation to gaze excursion or nystagmus noted. Hearing is grossly intact. Face is symmetric with normal facial animation. Speech is clear with no dysarthria noted. There is no hypophonia. There is no lip, neck/head, jaw or voice tremor. Neck is supple with full range of passive and active motion. There are no carotid  bruits on auscultation. Oropharynx exam reveals: mild mouth dryness, adequate dental hygiene and moderate airway crowding, due to small airway entry, Mallampati class III, tonsils on the smaller side, about 1+ bilaterally, uvula prominent but not swollen.  Neck circumference 17-1/8 inches, he has a minimal overbite.  Tongue protrudes centrally and palate elevates symmetrically.  Chest: Clear to auscultation without wheezing, rhonchi or crackles noted.  Heart: S1+S2+0, regular and normal without murmurs, rubs or gallops noted.   Abdomen: Soft, non-tender and non-distended.  Extremities: There is no obvious edema in the distal lower extremities bilaterally.   Skin: Warm and dry without trophic changes noted.   Musculoskeletal: exam reveals no obvious joint  deformities.   Neurologically:  Mental status: The patient is awake, alert and oriented in all 4 spheres. His immediate and remote memory, attention, language skills and fund of knowledge are appropriate. There is no evidence of aphasia, agnosia, apraxia or anomia. Speech is clear with normal prosody and enunciation. Thought process is linear. Mood is normal and affect is normal.  Cranial nerves II - XII are as described above under HEENT exam.  Motor exam: Normal bulk, strength and tone is noted. There is no obvious action or resting tremor.  Fine motor skills and coordination: grossly intact.  Cerebellar testing: No dysmetria or intention tremor. There is no truncal or gait ataxia.  Sensory exam: intact to light touch in the upper and lower extremities.  Gait, station and balance: He stands easily. No veering to one side is noted. No leaning to one side is noted. Posture is age-appropriate and stance is narrow based. Gait shows normal stride length and normal pace. No problems turning are noted.   Assessment and Plan:  In summary, Warren Pennington is a very pleasant 27 y.o.-year old male with an underlying medical history of reflux disease,  blunt abdominal injury in 2019 requiring extensive surgeries, smoking, and obesity, whose history and physical exam are concerning for sleep disordered breathing, particularly obstructive sleep apnea (OSA). While a laboratory attended sleep study is typically considered "gold standard" for evaluation of sleep disordered breathing, we mutually agreed to pursue a home sleep test at this time.   I had a long chat with the patient about my findings and the diagnosis of sleep apnea, particularly OSA, its prognosis and treatment options. We talked about medical/conservative treatments, surgical interventions and non-pharmacological approaches for symptom control. I explained, in particular, the risks and ramifications of untreated moderate to severe OSA, especially with respect to developing cardiovascular disease down the road, including congestive heart failure (CHF), difficult to treat hypertension, cardiac arrhythmias (particularly A-fib), neurovascular complications including TIA, stroke and dementia. Even type 2 diabetes has, in part, been linked to untreated OSA. Symptoms of untreated OSA may include (but may not be limited to) daytime sleepiness, nocturia (i.e. frequent nighttime urination), memory problems, mood irritability and suboptimally controlled or worsening mood disorder such as depression and/or anxiety, lack of energy, lack of motivation, physical discomfort, as well as recurrent headaches, especially morning or nocturnal headaches. We talked about the importance of maintaining a healthy lifestyle and striving for healthy weight.  The importance of complete smoking cessation was also addressed.  In addition, we talked about the importance of striving for and maintaining good sleep hygiene. I recommended a sleep study at this time. I outlined the differences between a laboratory attended sleep study which is considered more comprehensive and accurate over the option of a home sleep test (HST); the  latter may lead to underestimation of sleep disordered breathing in some instances and does not help with diagnosing upper airway resistance syndrome and is not accurate enough to diagnose primary central sleep apnea typically. I outlined possible surgical and non-surgical treatment options of OSA, including the use of a positive airway pressure (PAP) device (i.e. CPAP, AutoPAP/APAP or BiPAP in certain circumstances), a custom-made dental device (aka oral appliance, which would require a referral to a specialist dentist or orthodontist typically, and is generally speaking not considered for patients with full dentures or edentulous state), upper airway surgical options, such as traditional UPPP (which is not considered a first-line treatment) or the Inspire device (hypoglossal nerve stimulator, which would involve a  referral for consultation with an ENT surgeon, after careful selection, following inclusion criteria - also not first-line treatment). I explained the PAP treatment option to the patient in detail, as this is generally considered first-line treatment.  The patient indicated that he would be willing to try PAP therapy, if the need arises. I explained the importance of being compliant with PAP treatment, not only for insurance purposes but primarily to improve patient's symptoms symptoms, and for the patient's long term health benefit, including to reduce His cardiovascular risks longer-term.    We will pick up our discussion about the next steps and treatment options after testing.  We will keep him posted as to the test results by phone call and/or MyChart messaging where possible.  We will plan to follow-up in sleep clinic accordingly as well.  I answered all his questions today and the patient was in agreement.   I encouraged him to call with any interim questions, concerns, problems or updates or email [place] through [place].  Generally speaking, sleep test authorizations may take up to 2 weeks,  sometimes less, sometimes longer, the patient is encouraged to get in touch with [place] if they do not hear back from the sleep lab staff directly within the next 2 weeks.  Thank you very much for allowing me to participate in the care of this nice patient. If I can be of any further assistance to you please do not hesitate to call me at [phone].  Sincerely,   Star Age, MD, PhD

## 2022-07-06 ENCOUNTER — Ambulatory Visit: Payer: 59 | Admitting: Family Medicine

## 2022-07-07 ENCOUNTER — Institutional Professional Consult (permissible substitution): Payer: 59 | Admitting: Neurology

## 2022-07-08 ENCOUNTER — Ambulatory Visit: Payer: 59 | Admitting: Neurology

## 2022-07-08 DIAGNOSIS — G4731 Primary central sleep apnea: Secondary | ICD-10-CM

## 2022-07-08 DIAGNOSIS — R030 Elevated blood-pressure reading, without diagnosis of hypertension: Secondary | ICD-10-CM

## 2022-07-08 DIAGNOSIS — Z9189 Other specified personal risk factors, not elsewhere classified: Secondary | ICD-10-CM

## 2022-07-08 DIAGNOSIS — Z820 Family history of epilepsy and other diseases of the nervous system: Secondary | ICD-10-CM

## 2022-07-08 DIAGNOSIS — G4733 Obstructive sleep apnea (adult) (pediatric): Secondary | ICD-10-CM

## 2022-07-08 DIAGNOSIS — G4734 Idiopathic sleep related nonobstructive alveolar hypoventilation: Secondary | ICD-10-CM

## 2022-07-08 DIAGNOSIS — E669 Obesity, unspecified: Secondary | ICD-10-CM

## 2022-07-08 DIAGNOSIS — R0683 Snoring: Secondary | ICD-10-CM

## 2022-07-08 DIAGNOSIS — G4719 Other hypersomnia: Secondary | ICD-10-CM

## 2022-07-08 DIAGNOSIS — R0681 Apnea, not elsewhere classified: Secondary | ICD-10-CM

## 2022-07-14 NOTE — Addendum Note (Signed)
Addended by: Star Age on: 07/14/2022 05:27 PM   Modules accepted: Orders

## 2022-07-14 NOTE — Procedures (Signed)
[facility]  HOME SLEEP TEST (Watch PAT) REPORT  STUDY DATE: 07/09/22  DOB: Aug 13, 1995  MRN: [id number]  ORDERING CLINICIAN: Star Age, MD, PhD   REFERRING CLINICIAN: Rubie Maid, FNP   CLINICAL INFORMATION/HISTORY: 27 year old male with an underlying medical history of reflux disease, blunt abdominal injury in 2019 requiring extensive surgeries, smoking, and obesity, who reports snoring and excessive daytime somnolence, as well as witnessed apneas.   Epworth sleepiness score: 14/24.  BMI: 39.4 kg/m  FINDINGS:   Sleep Summary:   Total Recording Time (hours, min): 6 hours, 37 min  Total Sleep Time (hours, min):  6 hours, 7 min  Percent REM (%):    21.1%   Respiratory Indices:   Calculated pAHI (per hour):  80/hour         REM pAHI:    73.5/hour       NREM pAHI: 81.7/hour  Central pAHI: 14.2/hour  Oxygen Saturation Statistics:    Oxygen Saturation (%) Mean: 92%   Minimum oxygen saturation (%):                 78%   O2 Saturation Range (%): 78 - 99%    O2 Saturation (minutes) <=88%: 34.7 min  Pulse Rate Statistics:   Pulse Mean (bpm):    74/min    Pulse Range (53 - 113/min)   IMPRESSION: OSA (obstructive sleep apnea), severe Central Sleep Apnea Nocturnal Hypoxemia  RECOMMENDATION:  This home sleep test demonstrates severe obstructive sleep apnea with a total AHI of 80/hour and O2 nadir of 78% with significant time below or at 88% saturation of over 30 minutes, indicating nocturnal hypoxemia. There was a mild central sleep apnea component as well. Snoring was detected, ranging from mild to loud. Treatment with positive airway pressure is highly recommended. The patient will be advised to proceed with an autoPAP titration/trial at home for now. A laboratory attended titration study can be considered in the future for optimization of treatment settings and to improve tolerance and compliance. Alternative treatment options are limited  secondary to the severity of the patient's sleep disordered breathing, but may include surgical treatment with an implantable hypoglossal nerve stimulator (in carefully selected candidates, meeting criteria).  Concomitant weight loss is highly recommended, as well as complete smoking cessation. Please note, that untreated obstructive sleep apnea may carry additional perioperative morbidity. Patients with significant obstructive sleep apnea should receive perioperative PAP therapy and the surgeons and particularly the anesthesiologist should be informed of the diagnosis and the severity of the sleep disordered breathing. The patient should be cautioned not to drive, work at heights, or operate dangerous or heavy equipment when tired or sleepy. Review and reiteration of good sleep hygiene measures should be pursued with any patient. Other causes of the patient's symptoms, including circadian rhythm disturbances, an underlying mood disorder, medication effect and/or an underlying medical problem cannot be ruled out based on this test. Clinical correlation is recommended.  The patient and his referring provider will be notified of the test results. The patient will be seen in follow up in sleep clinic at [facility].  I certify that I have reviewed the raw data recording prior to the issuance of this report in accordance with the standards of the American Academy of Sleep Medicine (AASM).    INTERPRETING PHYSICIAN:   Star Age, MD, PhD Medical Director, [facility]) [place], ABPN (Neurology and Sleep)   [facility] [address] [place], [place] [address] [phone]  [phone]         

## 2022-07-15 ENCOUNTER — Telehealth: Payer: Self-pay | Admitting: *Deleted

## 2022-07-15 ENCOUNTER — Encounter: Payer: Self-pay | Admitting: Family Medicine

## 2022-07-15 ENCOUNTER — Ambulatory Visit: Payer: 59 | Admitting: Family Medicine

## 2022-07-15 VITALS — BP 136/98 | HR 90 | Temp 97.9°F | Ht 67.0 in | Wt 248.0 lb

## 2022-07-15 DIAGNOSIS — F17210 Nicotine dependence, cigarettes, uncomplicated: Secondary | ICD-10-CM

## 2022-07-15 DIAGNOSIS — R030 Elevated blood-pressure reading, without diagnosis of hypertension: Secondary | ICD-10-CM | POA: Diagnosis not present

## 2022-07-15 MED ORDER — BUPROPION HCL ER (SR) 150 MG PO TB12: 150.0000 mg | ORAL_TABLET | Freq: Two times a day (BID) | ORAL | 2 refills | Status: AC

## 2022-07-15 NOTE — Progress Notes (Signed)
Acute Office Visit  Subjective:     Patient ID: Warren Pennington, male    DOB: 1995/12/05, 27 y.o.   MRN: [id number]  No chief complaint on file.   HPI Patient is in today for blood pressure follow up. He was seen to establish care with me on 06/08/2022 at which time his BP was 128/100 in office. He has been monitoring at home using a radial cuff and readings have been 120/87, 130/90, 128/82, 109/87, 134/92, 119/92, 129/81, 117/83, 137/91, 127/85, and 120/85. He has had some changes at home with childcare and getting less sleep. He has made some dietary changes and started exercising. He also had sleep apnea testing and is awaiting CPAP. He also plans to quit smoking but has not been able to afford the nicotine patches, wants to switch to Wellbutrin.  Recurrent headaches: no Visual changes: no Palpitations: no Dyspnea: no Chest pain: no Lower extremity edema: no Dizzy/lightheaded: no   Review of Systems  All other systems reviewed and are negative.   Past Medical History:  Diagnosis Date   Blunt injury of abdomen 08/05/2017   "hit w/2X4 @ work"   GERD (gastroesophageal reflux disease)    Physical exam, annual 06/08/2022   Past Surgical History:  Procedure Laterality Date   APPENDECTOMY  08/07/2017   BOWEL RESECTION N/A 08/07/2017   Procedure: SMALL BOWEL RESECTION;  Surgeon: Coralie Keens, MD;  Location: [facility];  Service: General;  Laterality: N/A;   COLON SURGERY     ILEOCECETOMY N/A 08/07/2017   Procedure: Pennie Rushing;  Surgeon: Coralie Keens, MD;  Location: [facility];  Service: General;  Laterality: N/A;   IR RADIOLOGIST EVAL & MGMT  08/24/2017   LAPAROTOMY N/A 08/07/2017   Procedure: EXPLORATORY LAPAROTOMY;  Surgeon: Coralie Keens, MD;  Location: [facility];  Service: General;  Laterality: N/A;   No current outpatient medications on file prior to visit.   No current facility-administered medications on file prior to visit.   No Known Allergies      Objective:     BP (!) 136/98   Pulse 90   Temp 97.9 F (36.6 C) (Oral)   Ht 5\' 7"  (1.702 m)   Wt 248 lb (112.5 kg)   SpO2 96%   BMI 38.84 kg/m  BP Readings from Last 3 Encounters:  07/15/22 (!) 136/98  06/23/22 (!) 136/94  06/08/22 (!) 128/100      Physical Exam Vitals and nursing note reviewed.  Constitutional:      Appearance: Normal appearance. He is normal weight.  HENT:     Head: Normocephalic and atraumatic.  Cardiovascular:     Rate and Rhythm: Normal rate and regular rhythm.     Pulses: Normal pulses.     Heart sounds: Normal heart sounds.  Pulmonary:     Effort: Pulmonary effort is normal.     Breath sounds: Normal breath sounds.  Skin:    General: Skin is warm and dry.     Capillary Refill: Capillary refill takes less than 2 seconds.  Neurological:     General: No focal deficit present.     Mental Status: He is alert and oriented to person, place, and time. Mental status is at baseline.  Psychiatric:        Mood and Affect: Mood normal.        Behavior: Behavior normal.        Thought Content: Thought content normal.        Judgment: Judgment normal.  No results found for any visits on 07/15/22.      Assessment & Plan:   Problem List Items Addressed This Visit       Other   Tobacco dependence due to cigarettes    Unable to afford nicotine patches, would like to try Wellbutrin SR. Start 150mg  daily for 3 days then increase to BID. Target quit date 4/4. Goodrx coupon provided. 3-5 minute discussion regarding the harms of tobacco use, the benefits of cessation, and methods of cessation. Discussed that there are medication options to help with cessation. Provided printed education on steps to quit smoking.       Elevated blood pressure reading in office without diagnosis of hypertension - Primary    At home readings are consistently <140/90. Cuff correlated to office cuff. He is motivated to lose weight and quit smoking. CPAP in the works. Recommended heart  healthy diet and 150 minutes weekly of moderate intensity exercise. Denies chest pain, shortness of breath, dizziness/lightheadedness, palpitations, headaches, vision changes, swelling of extremities and instructed to seek medical care for any of these. Will report home BP readings to office via [place] and return to office sooner if sustained >140/90. Follow up in 3 months.       Meds ordered this encounter  Medications   buPROPion (WELLBUTRIN SR) 150 MG 12 hr tablet    Sig: Take 1 tablet (150 mg total) by mouth 2 (two) times daily.    Dispense:  90 tablet    Refill:  2    Order Specific Question:   Supervising Provider    Answer:   Jenna Luo T [id number]    Return in about 3 months (around 10/15/2022) for BP follow up.  Rubie Maid, FNP

## 2022-07-15 NOTE — Assessment & Plan Note (Signed)
Unable to afford nicotine patches, would like to try Wellbutrin SR. Start 150mg  daily for 3 days then increase to BID. Target quit date 4/4. Goodrx coupon provided. 3-5 minute discussion regarding the harms of tobacco use, the benefits of cessation, and methods of cessation. Discussed that there are medication options to help with cessation. Provided printed education on steps to quit smoking.

## 2022-07-15 NOTE — Assessment & Plan Note (Addendum)
At home readings are consistently <140/90. Cuff correlated to office cuff. He is motivated to lose weight and quit smoking. CPAP in the works. Recommended heart healthy diet and 150 minutes weekly of moderate intensity exercise. Denies chest pain, shortness of breath, dizziness/lightheadedness, palpitations, headaches, vision changes, swelling of extremities and instructed to seek medical care for any of these. Will report home BP readings to office via [place] and return to office sooner if sustained >140/90. Follow up in 3 months.

## 2022-07-15 NOTE — Telephone Encounter (Signed)
Spoke with the patient and discussed sleep study results.  Patient verbalized understanding that he has severe OSA and Dr. Rexene Alberts recommends urgent set up with AutoPap therapy.  The patient is amenable to starting therapy.  However he did advise that he recently started a new job at the same time his wife did.  He currently does not have any insurance but will be eligible around June 18.  He states his option is to discuss payment plan for out-of-pocket expenses for CPAP or he can wait until he gets insurance.  I told him to go ahead and send an order over to [facility] for them to discuss and see what they can do for out-of-pocket pay.  Once he gets insurance they can obtain approval for supplies etc.  He verbalized understanding and will be on the lookout for a call from [facility]. I told him I would update Dr Rexene Alberts as well. He does understand the insurance compliance requirements which includes using the machine at least 4 hours at night and also being seen in our office between 30 and 90 days after set up.  I advised that if he does pay out-of-pocket for the machine then the specific 30 to 90-day window for insurance would not apply however we would still want to see him in that window of time for follow-up to see how he is doing.  He verbalized appreciation for the call.  Pt is aware of risks of not treating sleep apnea. He will notify us of any new updates.   Referral faxed to [facility]. Received a receipt of confirmation.

## 2022-07-15 NOTE — Telephone Encounter (Signed)
Sounds good, nothing further needed, thank you.

## 2022-07-15 NOTE — Telephone Encounter (Signed)
-----   Message from Star Age, MD sent at 07/14/2022  5:27 PM EDT ----- Urgent set up requested on PAP therapy, due to severe OSA. Patient referred by PCP NP, seen by me on 06/23/22, patient had a HST on 07/09/22.    Please call and notify the patient that the recent home sleep test showed obstructive sleep apnea in the severe range. I recommend treatment for this in the form of autoPAP, which means, that we don't have to bring him in for a sleep study with CPAP, but will let him start using a so called autoPAP machine at home, through a DME company (of his choice, or as per insurance requirement). The DME representative will fit the patient with a mask of choice, educate him on how to use the machine, how to put the mask on, etc. I have placed an order in the chart. Please send the order to a local DME, talk to patient, send report to referring MD. Please also reinforce the need for compliance with treatment. We will need a FU in sleep clinic for 10 weeks post-PAP set up, please arrange that with me or one of our NPs. Thanks,   Star Age, MD, PhD [facility])

## 2022-08-04 NOTE — Telephone Encounter (Signed)
Pt was scheduled for his initial CPAP on (10/08/22) Pt was informed to bring machine and power cord to the appointment.  DME in pt's [email or web address] to be scheduled within 30-90 days

## 2022-10-07 ENCOUNTER — Encounter: Payer: Self-pay | Admitting: Neurology

## 2022-10-08 ENCOUNTER — Ambulatory Visit: Payer: Self-pay | Admitting: Neurology

## 2022-10-27 ENCOUNTER — Encounter: Payer: Self-pay | Admitting: Neurology

## 2022-10-27 ENCOUNTER — Ambulatory Visit (INDEPENDENT_AMBULATORY_CARE_PROVIDER_SITE_OTHER): Payer: Managed Care, Other (non HMO) | Admitting: Neurology

## 2022-10-27 VITALS — BP 154/100 | HR 82 | Ht 66.0 in | Wt 240.8 lb

## 2022-10-27 DIAGNOSIS — G4733 Obstructive sleep apnea (adult) (pediatric): Secondary | ICD-10-CM | POA: Diagnosis not present

## 2022-10-27 DIAGNOSIS — Z789 Other specified health status: Secondary | ICD-10-CM | POA: Diagnosis not present

## 2022-10-27 NOTE — Progress Notes (Signed)
Subjective:    Patient ID: Warren Pennington, male    DOB: 09/21/1995, 27 y.o.   MRN: [id number]  HPI    Interim history:   Mr. Higinbotham is a 27 year old male with an underlying medical history of reflux disease, blunt abdominal injury in 2019 requiring extensive surgeries, smoking, and obesity, who presents for follow-up consultation of his obstructive sleep apnea after interim testing and starting home AutoPap therapy.  The patient is unaccompanied today.  I first met him at the request of his primary care nurse practitioner on 06/23/2022, at which time he reported snoring and excessive daytime somnolence as well as witnessed apneas.  He was advised to proceed with a sleep study.  He had a home sleep test on 07/08/2022 which showed  severe obstructive sleep apnea with a total AHI of 80/hour and O2 nadir of 78% with significant time below or at 88% saturation of over 30 minutes, indicating nocturnal hypoxemia. There was a mild central sleep apnea component as well. Snoring was detected, ranging from mild to loud.  He was advised to proceed with home AutoPap therapy.  His set up date was 07/28/2022.  He has a ResMed air sense 10 AutoSet machine.  His DME company is [facility].   Today, 10/27/2022: I reviewed his AutoPap compliance data for the past 30 days from 09/26/2022 through 10/25/2022, during which time he used his machine only 7 days with percent use days greater than 4 hours at 17% only, indicating significantly low compliance with an average usage for days of treatment of 5 hours and 29 minutes, residual AHI at goal at 1.5/h, 95th percentile of pressure at 12.1 cm with a range of 7 to 14 cm with EPR of 3.  Leak acceptable but on the higher side with significant fluctuation, 95th percentile at 18.2 L/min. Between 08/10/22 and 09/08/22 his compliance was a lot better with percent used days > 4 h at 73% and average usage of 6 hours and 1 minute.  Residual AHI at goal at 1.4/h, 95th percentile pressure 12.1 cm. He  reports that he had interim stressors, what with his infant son's medical issues, so he had lapses in his autoPAP therapy. Stress is better, son has had interim tests and patient feels he can focus more on his own health again. He is very motivated to continue with autoPAP therapy and, in fact, feels he had improvement in his snoring, and sleep quality as well as daytime energy. His ESS is 6/24, previously was 14. He uses a L Siesta 3B FFM. He has had some soreness on his nasal bridge. Tolerates the pressure, had to adjust the humidifier setting, as the moisture was accumulating in the mask. He has no new concerns today and would be interested in trial of another mask. I was able to provide him with a Sample Resmed F40 FFM starter pack. He has the medium insert also for his current mask.   The patient's allergies, current medications, family history, past medical history, past social history, past surgical history and problem list were reviewed and updated as appropriate.   Previously:   06/23/22: (He) reports snoring and excessive daytime somnolence, as well as witnessed apneas, per wife's report.  His Epworth sleepiness score is 14 out of 24, fatigue severity score is 35 out of 63.  I reviewed your office note from 06/08/2022.  His blood pressure was elevated at the time.  Smoking cessation was discussed.  He was advised to monitor his blood pressure.  His  blood pressure tends to be a little bit better at home, sometimes normal, he has been monitoring it first thing in the morning.  He goes to bed around 8 and rise time is around 5.  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  He is currently working for [employer].  He lives at home with his wife and 6-month-old son, his son sleeps in his own room.  They have outdoor cats, no indoor pets.  He denies night to night nocturia or recurrent  nocturnal or morning headaches.  His dad had sleep apnea and died at 16 with multiple medical complications including congestive heart failure.  Patient drinks caffeine in the form of coffee, 1 or 2 large cups per day.  He is working on smoking cessation, still smokes about a pack per day.  He drinks alcohol about 1 or 2 drinks per week.  He stopped smoking marijuana in December 2023.  He has a TV in his bedroom but does not watch it at night.  His snoring can be loud per wife and mom.    His Past Medical History Is Significant For: Past Medical History:  Diagnosis Date   Blunt injury of abdomen 08/05/2017   "hit w/2X4 @ work"   GERD (gastroesophageal reflux disease)    Physical exam, annual 06/08/2022    His Past Surgical History Is Significant For: Past Surgical History:  Procedure Laterality Date   APPENDECTOMY  08/07/2017   BOWEL RESECTION N/A 08/07/2017   Procedure: SMALL BOWEL RESECTION;  Surgeon: Abigail Miyamoto, MD;  Location: [facility] OR;  Service: General;  Laterality: N/A;   COLON SURGERY     ILEOCECETOMY N/A 08/07/2017   Procedure: Ferne Coe;  Surgeon: Abigail Miyamoto, MD;  Location: [facility] OR;  Service: General;  Laterality: N/A;   IR RADIOLOGIST EVAL & MGMT  08/24/2017   LAPAROTOMY N/A 08/07/2017   Procedure: EXPLORATORY LAPAROTOMY;  Surgeon: Abigail Miyamoto, MD;  Location: [facility] OR;  Service: General;  Laterality: N/A;    His Family History Is Significant For: Family History  Problem Relation Age of Onset   Heart failure Father     His Social History Is Significant For: Social History   Socioeconomic History   Marital status: Married    Spouse name: Not on file   Number of children: Not on file   Years of education: Not on file   Highest education level: 12th grade  Occupational History   Not on file  Tobacco Use   Smoking status: Every Day    Packs/day: 1.00    Years: 5.00    Additional pack years: 0.00    Total pack years: 5.00    Types: Cigarettes    Smokeless tobacco: Never  Vaping Use   Vaping Use: Never used  Substance and Sexual Activity   Alcohol use: Yes    Comment: 1-2 week   Drug use: Not Currently    Comment: quit 04-18-2022   Sexual activity: Yes    Birth control/protection: Condom  Other Topics Concern   Not on file  Social History Narrative   Caffiene 1-2 drinks per day.   Lives at home with wife and son.   Works at [employer]   Social Determinants of Health   Financial Resource Strain: Low Risk  (07/11/2022)   Overall Financial Resource Strain (CARDIA)    Difficulty of Paying Living Expenses: Not  very hard  Food Insecurity: Food Insecurity Present (07/11/2022)   Hunger Vital Sign    Worried About Running Out of Food in the Last Year: Sometimes true    Ran Out of Food in the Last Year: Never true  Transportation Needs: No Transportation Needs (07/11/2022)   PRAPARE - Administrator, Civil Service (Medical): No    Lack of Transportation (Non-Medical): No  Physical Activity: Insufficiently Active (07/11/2022)   Exercise Vital Sign    Days of Exercise per Week: 4 days    Minutes of Exercise per Session: 10 min  Stress: No Stress Concern Present (07/11/2022)   [employer] of Occupational Health - Occupational Stress Questionnaire    Feeling of Stress : Only a little  Social Connections: Moderately Isolated (07/11/2022)   Social Connection and Isolation Panel [NHANES]    Frequency of Communication with Friends and Family: More than three times a week    Frequency of Social Gatherings with Friends and Family: Once a week    Attends Religious Services: Never    Database administrator or Organizations: No    Attends Engineer, structural: Not on file    Marital Status: Married    His Allergies Are:  No Known Allergies:   His Current Medications Are:  Outpatient Encounter Medications as of 10/27/2022  Medication Sig   buPROPion (WELLBUTRIN SR) 150 MG 12 hr tablet Take 1 tablet  (150 mg total) by mouth 2 (two) times daily. (Patient not taking: Reported on 10/27/2022)   No facility-administered encounter medications on file as of 10/27/2022.  :  Review of Systems:  Out of a complete 14 point review of systems, all are reviewed and negative with the exception of these symptoms as listed below:  Review of Systems  Neurological:        Rm 5. Alone. Initial CPAP f/u. Patient states he has been irresponsible with CPAP, not consistently using.       Objective:   Physical Exam  Physical Examination:   Vitals:   10/27/22 1105  BP: (!) 154/100  Pulse: 82    General Examination: The patient is a very pleasant 27 y.o. male in no acute distress. He appears well-developed and well-nourished and well groomed.   HEENT: Normocephalic, atraumatic, pupils are equal, round and reactive to light, tracking is well-preserved, face is symmetric with normal facial animation.  Speech is clear without dysarthria, hypophonia or voice tremor.  No obvious stone or on nasal bridge.  Airway examination reveals mild mouth dryness, moderate airway crowding, tongue protrudes centrally and palate elevates symmetrically.  No carotid bruits.     Chest: Clear to auscultation without wheezing, rhonchi or crackles noted.   Heart: S1+S2+0, regular and normal without murmurs, rubs or gallops noted.    Abdomen: Soft, non-tender and non-distended.   Extremities: There is no obvious edema in the distal lower extremities bilaterally.    Skin: Warm and dry without trophic changes noted.    Musculoskeletal: exam reveals no obvious joint deformities.    Neurologically:  Mental status: The patient is awake, alert and oriented in all 4 spheres. His immediate and remote memory, attention, language skills and fund of knowledge are appropriate. There is no evidence of aphasia, agnosia, apraxia or anomia. Speech is clear with normal prosody and enunciation. Thought process is linear. Mood is normal and  affect is normal.  Cranial nerves II - XII are as described above under HEENT exam.  Motor exam: Normal bulk, strength  and tone is noted. There is no obvious action or resting tremor.  Fine motor skills and coordination: grossly intact.  Cerebellar testing: No dysmetria or intention tremor. There is no truncal or gait ataxia.  Sensory exam: intact to light touch in the upper and lower extremities.  Gait, station and balance: He stands easily. No veering to one side is noted. No leaning to one side is noted. Posture is age-appropriate and stance is narrow based. Gait shows normal stride length and normal pace. No problems turning are noted.    Assessment and Plan:  In summary, Koleson Reifsteck is a very pleasant 27 year old male with an underlying medical history of reflux disease, blunt abdominal injury in 2019 requiring extensive surgeries, smoking, and obesity, who presents for follow-up consultation of his obstructive sleep apnea after interim testing and starting home AutoPap therapy.  He had a home sleep test on 07/08/2022 which showed  severe obstructive sleep apnea with a total AHI of 80/hour and O2 nadir of 78% with significant time below or at 88% saturation of over 30 minutes, indicating nocturnal hypoxemia. There was a mild central sleep apnea component as well. Snoring ranged from mild to loud.  He has been on AutoPap therapy since 07/28/2022. He has a ResMed air sense 10 AutoSet machine.  His DME company is [facility].  He was compliant in the first month of treatment, he has benefited from treatment but has had a recent lapse in treatment due primarily to financial and family stress.  These have improved thankfully.  He is very motivated to continue with treatment and endorses improvement in daytime energy, sleep quality and snoring.  He also reports that his wife was very pleased with the outcome of AutoPap therapy.  He is encouraged to be consistent with treatment, I would like to keep the settings  the same.  He is advised to try the medium insert of his current fullface mask, I also provided a sample ResMed fullface mask starter pack, F40.  He is encouraged to try the medium or the large insert.  If he likes that he can reorder the mask through his DME provider, he can also ask for a different style of mask altogether through his DME company.  He is advised to follow-up routinely in this clinic in 6 months, sooner if needed.  I answered all his questions today and he was in agreement with our plan.  I reiterated the importance of treating moderate and severe OSA, explained, in particular, the risks and ramifications of untreated moderate to severe OSA, especially with respect to developing cardiovascular or neurovascular disease down the road.  I spent 40 minutes in total face-to-face time and in reviewing records during pre-charting, more than 50% of which was spent in counseling and coordination of care, reviewing test results, reviewing medications and treatment regimen and/or in discussing or reviewing the diagnosis of OSA, the prognosis and treatment options. Pertinent laboratory and imaging test results that were available during this visit with the patient were reviewed by me and considered in my medical decision making (see chart for details).

## 2022-10-27 NOTE — Patient Instructions (Signed)
It was nice to see you again today.  Please try to get back on track with your AutoPap machine, you can try the sample mask I provided, you can also try the medium insert for your current mask.  Ultimately, if you are still having trouble tolerating the full facemask, you can talk to your DME provider about other options.  Please continue using your autoPAP regularly. While your insurance requires that you use PAP at least 4 hours each night on 70% of the nights, I recommend, that you not skip any nights and use it throughout the night if you can. Getting used to PAP and staying with the treatment long term does take time and patience and discipline. Untreated obstructive sleep apnea when it is moderate to severe can have an adverse impact on cardiovascular health and raise her risk for heart disease, arrhythmias, hypertension, congestive heart failure, stroke and diabetes. Untreated obstructive sleep apnea causes sleep disruption, nonrestorative sleep, and sleep deprivation. This can have an impact on your day to day functioning and cause daytime sleepiness and impairment of cognitive function, memory loss, mood disturbance, and problems focussing. Using PAP regularly can improve these symptoms.

## 2023-05-03 NOTE — Patient Instructions (Signed)
 Please continue using your CPAP regularly. While your insurance requires that you use CPAP at least 4 hours each night on 70% of the nights, I recommend, that you not skip any nights and use it throughout the night if you can. Getting used to CPAP and staying with the treatment long term does take time and patience and discipline. Untreated obstructive sleep apnea when it is moderate to severe can have an adverse impact on cardiovascular health and raise her risk for heart disease, arrhythmias, hypertension, congestive heart failure, stroke and diabetes. Untreated obstructive sleep apnea causes sleep disruption, nonrestorative sleep, and sleep deprivation. This can have an impact on your day to day functioning and cause daytime sleepiness and impairment of cognitive function, memory loss, mood disturbance, and problems focussing. Using CPAP regularly can improve these symptoms.  We will update supply orders, today. DME: [facility] Ph: [phone]  Follow up in 1 year

## 2023-05-03 NOTE — Progress Notes (Addendum)
 PATIENT: Warren Pennington DOB: [birth year]  REASON FOR VISIT: follow up HISTORY FROM: patient  Virtual Visit via Telephone Note  I connected with Warren Pennington on 05/04/2023 at  9:00 AM EST by telephone and verified that I am speaking with the correct person using two identifiers.   I discussed the limitations, risks, security and privacy concerns of performing an evaluation and management service by telephone and the availability of in person appointments. I also discussed with the patient that there may be a patient responsible charge related to this service. The patient expressed understanding and agreed to proceed.   History of Present Illness:  05/04/2023 ALL: Warren Pennington is a 28 y.o. male here today for follow up for OSA on CPAP. He was last seen by Dr Buck 10/2022 and struggling with compliance and mask fit. He was given a sample of Resmed F40 FFM and encouraged to work on compliance. Since, he reports compliance has improved. He is trying to use CPAP most every day. Four hour compliance remains low. Mostly related to staying up late at night. He is doing better with FFM. He needs replacement supplies.      History (copied from Dr Obie previous note)  Mr. Flessner is a 28 year old male with an underlying medical history of reflux disease, blunt abdominal injury in 2019 requiring extensive surgeries, smoking, and obesity, who presents for follow-up consultation of his obstructive sleep apnea after interim testing and starting home AutoPap therapy.  The patient is unaccompanied today.  I first met him at the request of his primary care nurse practitioner on 06/23/2022, at which time he reported snoring and excessive daytime somnolence as well as witnessed apneas.  He was advised to proceed with a sleep study.  He had a home sleep test on 07/08/2022 which showed  severe obstructive sleep apnea with a total AHI of 80/hour and O2 nadir of 78% with significant time below or at 88%  saturation of over 30 minutes, indicating nocturnal hypoxemia. There was a mild central sleep apnea component as well. Snoring was detected, ranging from mild to loud.  He was advised to proceed with home AutoPap therapy.  His set up date was 07/28/2022.  He has a ResMed air sense 10 AutoSet machine.  His DME company is [facility].    Today, 10/27/2022: I reviewed his AutoPap compliance data for the past 30 days from 09/26/2022 through 10/25/2022, during which time he used his machine only 7 days with percent use days greater than 4 hours at 17% only, indicating significantly low compliance with an average usage for days of treatment of 5 hours and 29 minutes, residual AHI at goal at 1.5/h, 95th percentile of pressure at 12.1 cm with a range of 7 to 14 cm with EPR of 3.  Leak acceptable but on the higher side with significant fluctuation, 95th percentile at 18.2 L/min. Between 08/10/22 and 09/08/22 his compliance was a lot better with percent used days > 4 h at 73% and average usage of 6 hours and 1 minute.  Residual AHI at goal at 1.4/h, 95th percentile pressure 12.1 cm. He reports that he had interim stressors, what with his infant son's medical issues, so he had lapses in his autoPAP therapy. Stress is better, son has had interim tests and patient feels he can focus more on his own health again. He is very motivated to continue with autoPAP therapy and, in fact, feels he had improvement in his snoring, and sleep quality as well as daytime  energy. His ESS is 6/24, previously was 14. He uses a L Siesta 3B FFM. He has had some soreness on his nasal bridge. Tolerates the pressure, had to adjust the humidifier setting, as the moisture was accumulating in the mask. He has no new concerns today and would be interested in trial of another mask. I was able to provide him with a Sample Resmed F40 FFM starter pack. He has the medium insert also for his current mask.    Observations/Objective:  Generalized: Well developed, in no  acute distress  Mentation: Alert oriented to time, place, history taking. Follows all commands speech and language fluent   Assessment and Plan:  28 y.o. year old male  has a past medical history of Blunt injury of abdomen (08/05/2017), GERD (gastroesophageal reflux disease), and Physical exam, annual (06/08/2022). here with  No diagnosis found.  Renold is doing well. Compliance report shows optimal daily but sub optimal four hour compliance. AHI slightly elevated. I will recheck compliance and AHI in 6-8 weeks. May increase pressure if needed. He was encouraged to use CPAP nightly for at least 4 hours. Supply orders updated. He will continue healthy lifestyle habits. Follow up with me in 1 year.   No orders of the defined types were placed in this encounter.   No orders of the defined types were placed in this encounter.    Follow Up Instructions:  I discussed the assessment and treatment plan with the patient. The patient was provided an opportunity to ask questions and all were answered. The patient agreed with the plan and demonstrated an understanding of the instructions.   The patient was advised to call back or seek an in-person evaluation if the symptoms worsen or if the condition fails to improve as anticipated.  I provided 15 minutes of non-face-to-face time during this encounter. Patient located at their place of residence during Mychart visit. Provider is in the office.    Shakeyla Giebler, NP

## 2023-05-04 ENCOUNTER — Telehealth (INDEPENDENT_AMBULATORY_CARE_PROVIDER_SITE_OTHER): Payer: Managed Care, Other (non HMO) | Admitting: Family Medicine

## 2023-05-04 ENCOUNTER — Encounter: Payer: Self-pay | Admitting: Family Medicine

## 2023-05-04 DIAGNOSIS — G4733 Obstructive sleep apnea (adult) (pediatric): Secondary | ICD-10-CM | POA: Diagnosis not present

## 2023-06-22 ENCOUNTER — Encounter: Payer: Self-pay | Admitting: Family Medicine

## 2023-06-22 ENCOUNTER — Telehealth: Payer: Self-pay | Admitting: Family Medicine

## 2023-06-22 NOTE — Telephone Encounter (Signed)
 Can you guys attach a 90 day download please? TY!

## 2023-06-22 NOTE — Telephone Encounter (Signed)
 Marland Kitchen

## 2024-05-08 NOTE — Progress Notes (Unsigned)
 Warren Pennington

## 2024-05-09 ENCOUNTER — Encounter: Payer: Self-pay | Admitting: Family Medicine

## 2024-05-09 ENCOUNTER — Telehealth: Payer: Managed Care, Other (non HMO) | Admitting: Family Medicine

## 2024-05-09 DIAGNOSIS — G4733 Obstructive sleep apnea (adult) (pediatric): Secondary | ICD-10-CM | POA: Diagnosis not present

## 2024-05-09 NOTE — Patient Instructions (Signed)

## 2024-05-09 NOTE — Progress Notes (Signed)
 "  PATIENT: Warren Pennington DOB: 1995-09-22  REASON FOR VISIT: follow up HISTORY FROM: patient  Virtual Visit via Mychart Video Note  I connected with Franky Fuller on 05/04/2023 at  8:45 AM EST by video and verified that I am speaking with the correct person using two identifiers.   I discussed the limitations, risks, security and privacy concerns of performing an evaluation and management service by video and the availability of in person appointments. I also discussed with the patient that there may be a patient responsible charge related to this service. The patient expressed understanding and agreed to proceed.   History of Present Illness:  05/09/2024 ALL (Mychart): Warren Pennington returns for follow up for OSA on CPAP. He continues to do well on therapy. He admits that he forgets to put his machine on more nights that he wishes. He does note better sleep quality and improved daytime energy when using it. He denies concerns with machine or supplies.     05/04/2023 ALL (Mychart): Korbyn Pennington is a 29 y.o. male here today for follow up for OSA on CPAP. He was last seen by Dr Buck 10/2022 and struggling with compliance and mask fit. He was given a sample of Resmed F40 FFM and encouraged to work on compliance. Since, he reports compliance has improved. He is trying to use CPAP most every day. Four hour compliance remains low. Mostly related to staying up late at night. He is doing better with FFM. He needs replacement supplies.      History (copied from Dr Obie previous note)  Warren Pennington is a 29 year old male with an underlying medical history of reflux disease, blunt abdominal injury in 2019 requiring extensive surgeries, smoking, and obesity, who presents for follow-up consultation of his obstructive sleep apnea after interim testing and starting home AutoPap therapy.  The patient is unaccompanied today.  I first met him at the request of his primary care nurse practitioner on 06/23/2022, at  which time he reported snoring and excessive daytime somnolence as well as witnessed apneas.  He was advised to proceed with a sleep study.  He had a home sleep test on 07/08/2022 which showed  severe obstructive sleep apnea with a total AHI of 80/hour and O2 nadir of 78% with significant time below or at 88% saturation of over 30 minutes, indicating nocturnal hypoxemia. There was a mild central sleep apnea component as well. Snoring was detected, ranging from mild to loud.  He was advised to proceed with home AutoPap therapy.  His set up date was 07/28/2022.  He has a ResMed air sense 10 AutoSet machine.  His DME company is [facility].    Today, 10/27/2022: I reviewed his AutoPap compliance data for the past 30 days from 09/26/2022 through 10/25/2022, during which time he used his machine only 7 days with percent use days greater than 4 hours at 17% only, indicating significantly low compliance with an average usage for days of treatment of 5 hours and 29 minutes, residual AHI at goal at 1.5/h, 95th percentile of pressure at 12.1 cm with a range of 7 to 14 cm with EPR of 3.  Leak acceptable but on the higher side with significant fluctuation, 95th percentile at 18.2 L/min. Between 08/10/22 and 09/08/22 his compliance was a lot better with percent used days > 4 h at 73% and average usage of 6 hours and 1 minute.  Residual AHI at goal at 1.4/h, 95th percentile pressure 12.1 cm. He reports that he had interim stressors,  what with his infant son's medical issues, so he had lapses in his autoPAP therapy. Stress is better, son has had interim tests and patient feels he can focus more on his own health again. He is very motivated to continue with autoPAP therapy and, in fact, feels he had improvement in his snoring, and sleep quality as well as daytime energy. His ESS is 6/24, previously was 14. He uses a L [place] 3B FFM. He has had some soreness on his nasal bridge. Tolerates the pressure, had to adjust the humidifier setting, as  the moisture was accumulating in the mask. He has no new concerns today and would be interested in trial of another mask. I was able to provide him with a Sample Resmed F40 FFM starter pack. He has the medium insert also for his current mask.    Observations/Objective:  Generalized: Well developed, in no acute distress  Mentation: Alert oriented to time, place, history taking. Follows all commands speech and language fluent   Assessment and Plan:  29 y.o. year old male  has a past medical history of Blunt injury of abdomen (08/05/2017), GERD (gastroesophageal reflux disease), and Physical exam, annual (06/08/2022). here with    ICD-10-CM   1. OSA on CPAP  G47.33 For home use only DME continuous positive airway pressure (CPAP)     Warren Pennington is doing well. Compliance report shows sub optimal daily and four hour compliance. AHI well managed. He was encouraged to use CPAP nightly for at least 4 hours. Supply orders updated. He will continue healthy lifestyle habits. Follow up with me in 1 year.   Orders Placed This Encounter  Procedures   For home use only DME continuous positive airway pressure (CPAP)    Heated Humidity with all supplies as needed    Length of Need:   Lifetime    Patient has OSA or probable OSA:   Yes    Is the patient currently using CPAP in the home:   Yes    Settings:   Other see comments    CPAP supplies needed:   Mask, headgear, cushions, filters, heated tubing and water chamber    No orders of the defined types were placed in this encounter.    Follow Up Instructions:  I discussed the assessment and treatment plan with the patient. The patient was provided an opportunity to ask questions and all were answered. The patient agreed with the plan and demonstrated an understanding of the instructions.   The patient was advised to call back or seek an in-person evaluation if the symptoms worsen or if the condition fails to improve as anticipated.  I provided 15  minutes of non-face-to-face time during this encounter. Patient located at their place of residence during Mychart visit. Provider is in the office.    Makinzy Cleere, NP  "

## 2024-05-11 ENCOUNTER — Telehealth: Payer: Self-pay

## 2024-05-11 NOTE — Telephone Encounter (Signed)
 Cpap order sent to dme DME: [facility] Phone:[phone] Fax:[phone]

## 2024-05-16 NOTE — Telephone Encounter (Signed)
 Warren Pennington

## 2025-05-15 ENCOUNTER — Telehealth: Admitting: Family Medicine
